# Patient Record
Sex: Female | Born: 1985 | Race: Black or African American | Hispanic: No | Marital: Married | Smoking: Never smoker
Health system: Southern US, Community
[De-identification: ages and names within clinical notes are randomized; demographics above are authoritative.]

## PROBLEM LIST (undated history)

## (undated) DIAGNOSIS — K219 Gastro-esophageal reflux disease without esophagitis: Secondary | ICD-10-CM

## (undated) DIAGNOSIS — S20419A Abrasion of unspecified back wall of thorax, initial encounter: Secondary | ICD-10-CM

## (undated) DIAGNOSIS — R1909 Other intra-abdominal and pelvic swelling, mass and lump: Secondary | ICD-10-CM

## (undated) DIAGNOSIS — G43909 Migraine, unspecified, not intractable, without status migrainosus: Secondary | ICD-10-CM

## (undated) DIAGNOSIS — D649 Anemia, unspecified: Secondary | ICD-10-CM

## (undated) DIAGNOSIS — M549 Dorsalgia, unspecified: Secondary | ICD-10-CM

## (undated) DIAGNOSIS — F419 Anxiety disorder, unspecified: Secondary | ICD-10-CM

## (undated) DIAGNOSIS — N939 Abnormal uterine and vaginal bleeding, unspecified: Secondary | ICD-10-CM

## (undated) HISTORY — DX: Anemia, unspecified: D64.9

## (undated) HISTORY — DX: Migraine, unspecified, not intractable, without status migrainosus: G43.909

---
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
## 2010-07-31 ENCOUNTER — Emergency Department (HOSPITAL_BASED_OUTPATIENT_CLINIC_OR_DEPARTMENT_OTHER)
Admission: EM | Admit: 2010-07-31 | Discharge: 2010-07-31 | Disposition: A | Discharge status: Home / Self Care | Payer: 59 | Attending: Emergency Medicine | Admitting: Emergency Medicine

## 2010-07-31 DIAGNOSIS — M545 Low back pain, unspecified: Secondary | ICD-10-CM | POA: Insufficient documentation

## 2010-07-31 DIAGNOSIS — R51 Headache: Secondary | ICD-10-CM | POA: Insufficient documentation

## 2010-07-31 LAB — URINALYSIS, ROUTINE W REFLEX MICROSCOPIC
Glucose, UA: NEGATIVE mg/dL
Hgb urine dipstick: NEGATIVE
Specific Gravity, Urine: 1.018 (ref 1.005–1.030)
Urobilinogen, UA: 0.2 mg/dL (ref 0.0–1.0)

## 2010-07-31 LAB — PREGNANCY, URINE: Preg Test, Ur: NEGATIVE

## 2010-08-14 ENCOUNTER — Encounter: Payer: Self-pay | Admitting: Family Medicine

## 2010-08-14 ENCOUNTER — Ambulatory Visit (INDEPENDENT_AMBULATORY_CARE_PROVIDER_SITE_OTHER): Payer: 59 | Admitting: Family Medicine

## 2010-08-14 DIAGNOSIS — M549 Dorsalgia, unspecified: Secondary | ICD-10-CM

## 2010-08-14 DIAGNOSIS — R51 Headache: Secondary | ICD-10-CM

## 2010-08-14 DIAGNOSIS — R079 Chest pain, unspecified: Secondary | ICD-10-CM

## 2010-08-14 MED ORDER — NAPROXEN 500 MG PO TABS: 500.0000 mg | ORAL_TABLET | Freq: Two times a day (BID) | ORAL | Status: DC

## 2010-08-14 MED ORDER — CYCLOBENZAPRINE HCL 10 MG PO TABS: 10.0000 mg | ORAL_TABLET | Freq: Three times a day (TID) | ORAL | Status: DC | PRN

## 2010-08-14 NOTE — Progress Notes (Signed)
  Subjective:    Patient ID: Katelyn [name], female    DOB: May 13, 1986, 25 y.o.   MRN: [id number]  HPI Here today to establish care.  [facility]  HAs- has been having daily HAs x3 weeks.  Went to ER last week b/c of severity and was started on Tramadol.  Pain would ease w/ tramadol but not resolve.  HAs are typically occipital and on top of head.  Pain described as a stabbing.  Pain will travel up the back of the head, including the neck.  During the HA pt will have some blurry vision, some dizziness, some nausea.  + photo and phonophobia.  No hx of migraines, no family hx of migraines.  No focal weakness.  Did not have head CT at ER.  Pt denies increased stressors.  Back pain- sxs started ~2 yrs ago.  Bilaterally.  Will run from mid thoracic to waistline.  Requires lumbar support to feel comfortable.  Pain w/ prolonged bending or stooping.  Pain improves w/ arching/extension.  Pain will radiate into butt and legs.  No relief w/ tramadol.  Pain will spontaneously improve.  Chest pressure- sxs started 1 week ago.  intermittant- 4x this week.  Improves w/ deep breathing.  Will last a few minutes each episode.  Some mild nausea.  No relation to eating.  Denies relation to stress.  Doesn't have GERD sxs.  No hx of asthma.    Review of Systems For ROS see HPI     Objective:   Physical Exam  Constitutional: She is oriented to person, place, and time. She appears well-developed and well-nourished. No distress.  HENT:  Head: Normocephalic and atraumatic.  Eyes: Conjunctivae and EOM are normal. Pupils are equal, round, and reactive to light.  Neck: Normal range of motion. Neck supple. No thyromegaly present.  Cardiovascular: Normal rate, regular rhythm, normal heart sounds and intact distal pulses.   No murmur heard. Pulmonary/Chest: Effort normal and breath sounds normal. No respiratory distress. She has no wheezes.  Musculoskeletal: Normal  range of motion. She exhibits no edema and no tenderness.       + Trap spasm bilaterally + paraspinal spasm Good flexion and extension of back, (-) SLR bilaterally  Lymphadenopathy:    She has no cervical adenopathy.  Neurological: She is alert and oriented to person, place, and time. She has normal reflexes. No cranial nerve deficit. Coordination normal.  Skin: Skin is warm and dry.  Psychiatric: She has a normal mood and affect. Her behavior is normal.          Assessment & Plan:

## 2010-08-14 NOTE — Patient Instructions (Signed)
Please schedule your complete physical at your convenience in the next 2-3 months (do not eat before this appt) Your EKG looks great!  No cause for concern! Keep track of your chest pressure and see if you can figure out a trend- time of day, relation to food, stress, etc Take the Naproxen 2x/day for 7-10 days for pain and then as needed.  Take w/ food to avoid upset stomach Use the muscle relaxer (flexeril) at night and on weekends as needed- it will make you sleepy Make sure you are wearing a good, supportive bra Get a massage if able Call with any questions or concerns Welcome!  We're glad to have you!

## 2010-08-25 DIAGNOSIS — R079 Chest pain, unspecified: Secondary | ICD-10-CM | POA: Insufficient documentation

## 2010-08-25 NOTE — Assessment & Plan Note (Signed)
Atypical for cardiac.  EKG normal.  Encouraged her to track sxs and determine if she can correlate her pain to anything in particular.  Reviewed supportive care and red flags that should prompt return.  Pt expressed understanding and is in agreement w/ plan.Marland Kitchen

## 2010-08-25 NOTE — Assessment & Plan Note (Signed)
Appear to be tension HA related to muscle spasm.  Neuro exam WNL.  Muscle relaxer and NSAIDs should improve sxs.  Will follow.

## 2010-08-25 NOTE — Assessment & Plan Note (Signed)
Pt's pain appears to be related to muscle spasm.  Start muscle relaxer, NSAID.  Heat prn.  Reviewed importance of good, supportive bra to take pressure off her shoulders and midback.  Reviewed supportive care and red flags that should prompt return.  Pt expressed understanding and is in agreement w/ plan.

## 2010-10-09 ENCOUNTER — Ambulatory Visit: Payer: 59 | Admitting: Family Medicine

## 2010-10-19 ENCOUNTER — Ambulatory Visit (INDEPENDENT_AMBULATORY_CARE_PROVIDER_SITE_OTHER): Payer: 59 | Admitting: Family Medicine

## 2010-10-19 VITALS — BP 110/78 | Temp 98.7°F | Wt 244.0 lb

## 2010-10-19 DIAGNOSIS — M549 Dorsalgia, unspecified: Secondary | ICD-10-CM

## 2010-10-19 DIAGNOSIS — N926 Irregular menstruation, unspecified: Secondary | ICD-10-CM | POA: Insufficient documentation

## 2010-10-19 LAB — POCT URINE PREGNANCY: Preg Test, Ur: NEGATIVE

## 2010-10-19 NOTE — Assessment & Plan Note (Signed)
Pt has seen GYN and felt that they 'blew me off'.  Most likely amenorrhea is related to body adjusting to NuvaRing and she is having premenstrual sxs.  Will monitor over the next few weeks now that pt has removed NuvaRing.  If period doesn't return will get pelvic US.  Pt expressed understanding and is in agreement w/ plan.

## 2010-10-19 NOTE — Patient Instructions (Signed)
If no period in the next 4-6 weeks, CALL ME! Continue the muscle relaxer and Naprosyn as needed We'll call you with your PT appt Continue heating pad Call with any questions or concerns Hang in there!!!

## 2010-10-19 NOTE — Progress Notes (Signed)
  Subjective:    Patient ID: Katelyn [name], female    DOB: Jul 02, 1985, 25 y.o.   MRN: [id number]  HPI Amenorrhea- no menses since March, home pregnancy tests are negative.  Using Nuva Ring- pulled ring out 10 days ago w/out menses.  Denies increased stress recently.  Started NuvaRing in Feb- missed period starting in March.  Has been on hormonal birth control previously and still had periods.  Pt feels she has gained weight quickly.  No hx of irregular menses.  Saw GYN last week and felt she rushed through it- [employer].  Yesterday had nausea, abd cramps, and back pain.  Back pain- will ease w/ muscle relaxers but not completely resolve.  Pain in bilateral trapezius and thoracic spine.  Has never seen ortho or done PT.     Review of Systems For ROS see HPI     Objective:   Physical Exam  Constitutional: She appears well-developed and well-nourished. No distress.  Musculoskeletal: She exhibits tenderness.       Bilateral trap and paraspinal spasm.  Good ROM of neck, back, and shoulders.          Assessment & Plan:

## 2010-10-19 NOTE — Assessment & Plan Note (Signed)
Pt continues to have diffuse muscle spasms in back and neck.  Continue NSAIDs and muscle relaxers.  Refer to PT.  Pt expressed understanding and is in agreement w/ plan.

## 2010-11-03 ENCOUNTER — Ambulatory Visit: Payer: 59 | Attending: Family Medicine | Admitting: Physical Therapy

## 2010-11-03 DIAGNOSIS — M2550 Pain in unspecified joint: Secondary | ICD-10-CM | POA: Insufficient documentation

## 2010-11-03 DIAGNOSIS — R293 Abnormal posture: Secondary | ICD-10-CM | POA: Insufficient documentation

## 2010-11-03 DIAGNOSIS — IMO0001 Reserved for inherently not codable concepts without codable children: Secondary | ICD-10-CM | POA: Insufficient documentation

## 2010-11-10 ENCOUNTER — Telehealth: Payer: Self-pay | Admitting: *Deleted

## 2010-11-10 ENCOUNTER — Ambulatory Visit (INDEPENDENT_AMBULATORY_CARE_PROVIDER_SITE_OTHER): Payer: 59 | Admitting: Family Medicine

## 2010-11-10 ENCOUNTER — Encounter: Payer: Self-pay | Admitting: Family Medicine

## 2010-11-10 DIAGNOSIS — Z803 Family history of malignant neoplasm of breast: Secondary | ICD-10-CM | POA: Insufficient documentation

## 2010-11-10 DIAGNOSIS — Z1231 Encounter for screening mammogram for malignant neoplasm of breast: Secondary | ICD-10-CM | POA: Insufficient documentation

## 2010-11-10 DIAGNOSIS — Z0000 Encounter for general adult medical examination without abnormal findings: Secondary | ICD-10-CM

## 2010-11-10 LAB — BASIC METABOLIC PANEL
CO2: 27 mEq/L (ref 19–32)
Chloride: 107 mEq/L (ref 96–112)
Creatinine, Ser: 0.8 mg/dL (ref 0.4–1.2)
GFR: 110.54 mL/min (ref >60.00)
Potassium: 3.9 mEq/L (ref 3.5–5.1)

## 2010-11-10 LAB — LIPID PANEL
HDL: 41.2 mg/dL (ref >39.00)
Triglycerides: 117 mg/dL (ref 0.0–149.0)

## 2010-11-10 LAB — CBC WITH DIFFERENTIAL/PLATELET
Basophils Absolute: 0 10*3/uL (ref 0.0–0.1)
Eosinophils Absolute: 0 10*3/uL (ref 0.0–0.7)
Hemoglobin: 12 g/dL (ref 12.0–15.0)
Lymphocytes Relative: 55.6 % — ABNORMAL HIGH (ref 12.0–46.0)
Monocytes Relative: 8.5 % (ref 3.0–12.0)
Neutro Abs: 1.2 10*3/uL — ABNORMAL LOW (ref 1.4–7.7)
Neutrophils Relative %: 35.4 % — ABNORMAL LOW (ref 43.0–77.0)
RBC: 4.39 Mil/uL (ref 3.87–5.11)
RDW: 14.2 % (ref 11.5–14.6)

## 2010-11-10 LAB — TSH: TSH: 1.43 u[IU]/mL (ref 0.35–5.50)

## 2010-11-10 LAB — HEPATIC FUNCTION PANEL: Albumin: 3.9 g/dL (ref 3.5–5.2)

## 2010-11-10 NOTE — Telephone Encounter (Signed)
Diannia Ruder from [facility] left message noting that they have spoken with the pt and she notes that family history of breast cancer was both grandmothers. One at age 48s and other in 43s. She notes that they go by the mothers history and her mother has not had breast cancer. They typically do not go by grandmothers history. She notes that they can do an ultrasound if the pt is having problems. Please advise.

## 2010-11-10 NOTE — Patient Instructions (Signed)
Follow up in 1 year or as needed We'll call you with your mammogram appt We'll notify you of your lab results Keep up the good work on diet and exercise!  You can do it! Call with any questions or concerns Have a great summer!

## 2010-11-10 NOTE — Progress Notes (Signed)
  Subjective:    Patient ID: Katelyn [name], female    DOB: [birth year], 25 y.o.   MRN: [id number]  HPI CPE- pt not interested in pap today b/c she has seen GYN and can't remember if they did one and knows insurance won't pay for 2.  No concerns about her health.  Strong family hx of breast cancer.  Both MGM and PGM.  MGM 1st dx'd in her 30s, PGM dx'd in her 44s.   Review of Systems Patient reports no vision/ hearing changes, adenopathy, fever, weight change,  persistant/recurrent hoarseness, swallowing issues, chest pain, palpitations, edema, persistant/recurrent cough, hemoptysis, dyspnea (rest/exertional/paroxysmal nocturnal), gastrointestinal bleeding (melena, rectal bleeding), abdominal pain, significant heartburn, bowel changes, GU symptoms (dysuria, hematuria, incontinence), Gyn symptoms (abnormal  bleeding, pain),  syncope, focal weakness, memory loss, numbness & tingling, skin/hair/nail changes, abnormal bruising or bleeding, anxiety, or depression.     Objective:   Physical Exam  General Appearance:    Alert, cooperative, no distress, appears stated age.  Obese.  Head:    Normocephalic, without obvious abnormality, atraumatic  Eyes:    PERRL, conjunctiva/corneas clear, EOM's intact, fundi    benign, both eyes  Ears:    Normal TM's and external ear canals, both ears  Nose:   Nares normal, septum midline, mucosa normal, no drainage    or sinus tenderness  Throat:   Lips, mucosa, and tongue normal; teeth and gums normal  Neck:   Supple, symmetrical, trachea midline, no adenopathy;    Thyroid: no enlargement/tenderness/nodules  Back:     Symmetric, no curvature, ROM normal, no CVA tenderness  Lungs:     Clear to auscultation bilaterally, respirations unlabored  Chest Wall:    No tenderness or deformity   Heart:    Regular rate and rhythm, S1 and S2 normal, no murmur, rub   or gallop  Breast Exam:    No tenderness, masses, or nipple abnormality  Abdomen:     Soft, non-tender,  bowel sounds active all four quadrants,    no masses, no organomegaly  Genitalia:    Deferred  Rectal:    Deferred  Extremities:   Extremities normal, atraumatic, no cyanosis or edema  Pulses:   2+ and symmetric all extremities  Skin:   Skin color, texture, turgor normal, no rashes or lesions  Lymph nodes:   Cervical, supraclavicular, and axillary nodes normal  Neurologic:   CNII-XII intact, normal strength, sensation and reflexes    throughout          Assessment & Plan:

## 2010-11-10 NOTE — Assessment & Plan Note (Signed)
Pt's PE WNL w/ exception of obesity.  Discussed importance of healthy diet and regular exercise.  Check labs.  Anticipatory guidance provided.

## 2010-11-10 NOTE — Telephone Encounter (Signed)
Pt is not having any current problems.  Her sister has already had her 1st mammo due to same family hx- was just trying to be proactive for pt.

## 2010-11-10 NOTE — Assessment & Plan Note (Signed)
Refer for early mamogram given family hx.

## 2010-11-11 NOTE — Telephone Encounter (Signed)
Kara notified.

## 2010-11-13 ENCOUNTER — Encounter: Payer: Self-pay | Admitting: *Deleted

## 2010-11-16 ENCOUNTER — Ambulatory Visit: Payer: 59 | Admitting: Physical Therapy

## 2010-11-19 ENCOUNTER — Encounter: Payer: 59 | Admitting: Rehabilitative and Restorative Service Providers"

## 2010-11-23 ENCOUNTER — Encounter: Payer: 59 | Admitting: Rehabilitative and Restorative Service Providers"

## 2010-11-26 ENCOUNTER — Ambulatory Visit: Payer: 59 | Attending: Family Medicine | Admitting: Rehabilitative and Restorative Service Providers"

## 2010-11-26 DIAGNOSIS — IMO0001 Reserved for inherently not codable concepts without codable children: Secondary | ICD-10-CM | POA: Insufficient documentation

## 2010-11-26 DIAGNOSIS — R293 Abnormal posture: Secondary | ICD-10-CM | POA: Insufficient documentation

## 2010-11-26 DIAGNOSIS — M2550 Pain in unspecified joint: Secondary | ICD-10-CM | POA: Insufficient documentation

## 2011-02-05 ENCOUNTER — Ambulatory Visit
Admission: RE | Admit: 2011-02-05 | Discharge: 2011-02-05 | Disposition: A | Discharge status: Home / Self Care | Payer: 59 | Source: Ambulatory Visit | Attending: Family Medicine | Admitting: Family Medicine

## 2011-02-05 ENCOUNTER — Telehealth: Payer: Self-pay

## 2011-02-05 ENCOUNTER — Encounter: Payer: Self-pay | Admitting: Family Medicine

## 2011-02-05 ENCOUNTER — Ambulatory Visit (INDEPENDENT_AMBULATORY_CARE_PROVIDER_SITE_OTHER): Payer: 59 | Admitting: Family Medicine

## 2011-02-05 DIAGNOSIS — M549 Dorsalgia, unspecified: Secondary | ICD-10-CM

## 2011-02-05 DIAGNOSIS — E010 Iodine-deficiency related diffuse (endemic) goiter: Secondary | ICD-10-CM

## 2011-02-05 DIAGNOSIS — R232 Flushing: Secondary | ICD-10-CM

## 2011-02-05 DIAGNOSIS — N951 Menopausal and female climacteric states: Secondary | ICD-10-CM

## 2011-02-05 DIAGNOSIS — E049 Nontoxic goiter, unspecified: Secondary | ICD-10-CM

## 2011-02-05 LAB — BASIC METABOLIC PANEL
CO2: 24 mEq/L (ref 19–32)
Calcium: 8.8 mg/dL (ref 8.4–10.5)
Glucose, Bld: 90 mg/dL (ref 70–99)
Sodium: 140 mEq/L (ref 135–145)

## 2011-02-05 LAB — CBC WITH DIFFERENTIAL/PLATELET
Basophils Relative: 0.5 % (ref 0.0–3.0)
Eosinophils Relative: 0.1 % (ref 0.0–5.0)
HCT: 37.5 % (ref 36.0–46.0)
Hemoglobin: 12.3 g/dL (ref 12.0–15.0)
Lymphs Abs: 2.1 10*3/uL (ref 0.7–4.0)
MCV: 83.1 fl (ref 78.0–100.0)
Monocytes Absolute: 0.5 10*3/uL (ref 0.1–1.0)
RBC: 4.51 Mil/uL (ref 3.87–5.11)
WBC: 4.4 10*3/uL — ABNORMAL LOW (ref 4.5–10.5)

## 2011-02-05 MED ORDER — NAPROXEN 500 MG PO TABS: 500.0000 mg | ORAL_TABLET | Freq: Two times a day (BID) | ORAL | Status: DC

## 2011-02-05 NOTE — Telephone Encounter (Signed)
Message copied by Beverely Low on Fri Feb 05, 2011  2:38 PM ------      Message from: Sheliah Hatch      Created: Fri Feb 05, 2011  1:05 PM       Very small thyroid nodules- plan is to recheck in 6-12 months

## 2011-02-05 NOTE — Progress Notes (Signed)
  Subjective:    Patient ID: Katelyn [name], female    DOB: Aug 30, 1985, 25 y.o.   MRN: [id number]  HPI Back pain- is taking 1 Naproxen daily for muscle 'tightness'.  Taking w/ food.  No upset stomach.  Reports this provides good relief.  Hot flashes- sxs started 1 week ago.  Will feel very hot and then it goes away after a few minutes, only to return a few minutes later w/ sweating.  Sweating episodes will last ~5 minutes and then resolve spontaneously.  Having 5 episodes/day.  Will also occur over night- pt will wake sweaty.  sxs started at end of last week and pt removed NuvaRing on Tuesday.   Review of Systems For ROS see HPI     Objective:   Physical Exam  Vitals reviewed. Constitutional: She appears well-developed and well-nourished. No distress.  HENT:  Head: Normocephalic and atraumatic.  Neck: Normal range of motion. Neck supple. Thyromegaly (diffuse enlargement w/out nodularity) present.  Cardiovascular: Normal rate, regular rhythm, normal heart sounds and intact distal pulses.   Pulmonary/Chest: Effort normal and breath sounds normal. No respiratory distress. She has no wheezes. She has no rales.  Lymphadenopathy:    She has no cervical adenopathy.  Skin: Skin is warm and dry.  Psychiatric: She has a normal mood and affect. Her behavior is normal.          Assessment & Plan:

## 2011-02-05 NOTE — Patient Instructions (Signed)
We will notify you of your [place] appt We'll let you know your lab results as soon as they are available I think this is all period related but we'll let you know Call with any questions or concerns Hang in there!!!

## 2011-02-05 NOTE — Telephone Encounter (Signed)
Pt aware.

## 2011-02-07 NOTE — Assessment & Plan Note (Signed)
This is most likely hormonal and due to upcoming menses.  Will check hormones to r/o premature ovarian failure but cautioned pt that these may not be accurate due to recent use of NuvaRing.  Will await labs and determine next appropriate step.

## 2011-02-07 NOTE — Assessment & Plan Note (Signed)
Improved on Naproxen.  Refill provided.  Reviewed possibility of gastritis w/ prolonged use.  Pt aware.

## 2011-02-07 NOTE — Assessment & Plan Note (Signed)
Given recent hot flashes and now thyromegaly on exam will get [place] to assess.

## 2011-02-08 ENCOUNTER — Telehealth: Payer: Self-pay

## 2011-02-08 NOTE — Progress Notes (Signed)
Quick Note:    Labs mailed.  ______

## 2011-02-08 NOTE — Telephone Encounter (Signed)
Labs mailed

## 2011-02-08 NOTE — Telephone Encounter (Signed)
Message copied by Beverely Low on Mon Feb 08, 2011 11:53 AM ------      Message from: Sheliah Hatch      Created: Fri Feb 05, 2011  4:51 PM       Labs look great!  Not menopausal- thyroid levels are normal.  This is all good news!

## 2011-05-03 ENCOUNTER — Emergency Department (INDEPENDENT_AMBULATORY_CARE_PROVIDER_SITE_OTHER): Payer: 59

## 2011-05-03 ENCOUNTER — Encounter (HOSPITAL_BASED_OUTPATIENT_CLINIC_OR_DEPARTMENT_OTHER): Payer: Self-pay | Admitting: *Deleted

## 2011-05-03 ENCOUNTER — Emergency Department (HOSPITAL_BASED_OUTPATIENT_CLINIC_OR_DEPARTMENT_OTHER)
Admission: EM | Admit: 2011-05-03 | Discharge: 2011-05-03 | Disposition: A | Discharge status: Home / Self Care | Payer: 59 | Attending: Emergency Medicine | Admitting: Emergency Medicine

## 2011-05-03 DIAGNOSIS — R0602 Shortness of breath: Secondary | ICD-10-CM

## 2011-05-03 DIAGNOSIS — R059 Cough, unspecified: Secondary | ICD-10-CM | POA: Insufficient documentation

## 2011-05-03 DIAGNOSIS — R05 Cough: Secondary | ICD-10-CM

## 2011-05-03 DIAGNOSIS — J40 Bronchitis, not specified as acute or chronic: Secondary | ICD-10-CM | POA: Insufficient documentation

## 2011-05-03 MED ORDER — ALBUTEROL SULFATE HFA 108 (90 BASE) MCG/ACT IN AERS
2.0000 | INHALATION_SPRAY | RESPIRATORY_TRACT | Status: DC
  Administered 2011-05-03: 2 via RESPIRATORY_TRACT
  Filled 2011-05-03: qty 6.7

## 2011-05-03 NOTE — ED Provider Notes (Signed)
History     CSN: [id number] Arrival date & time: 05/03/2011  2:17 PM   First MD Initiated Contact with Patient 05/03/11 1430      Chief Complaint  Patient presents with  . Cough     HPI Pt reports nasal congestion, cough and congestion x 3 days with mild SOB. No fevers or chills. No unilateral leg swelling or recent travel/surgery. No prior hx of DVT or PE. No sorethroat or myalgias. Nothing worsens or improves her symptoms. No orthopnea of PND or DOE. Symptoms are constant  Past Medical History  Diagnosis Date  . Anemia   . Migraines     History reviewed. No pertinent past surgical history.  Family History  Problem Relation Age of Onset  . Breast cancer Paternal Grandmother   . Breast cancer Maternal Grandmother   . Kidney disease Paternal Grandmother   . Kidney disease Maternal Grandmother   . Kidney disease Maternal Uncle   . Diabetes type I Paternal Grandmother   . Diabetes type I Maternal Grandmother   . Diabetes type I Maternal Aunt   . Diabetes Maternal Aunt   . Diabetes Maternal Uncle   . Asthma Sister     History  Substance Use Topics  . Smoking status: Never Smoker   . Smokeless tobacco: Not on file   Comment: casual smoker in college  . Alcohol Use: Yes     occ.    OB History    Grav Para Term Preterm Abortions TAB SAB Ect Mult Living                  Review of Systems  All other systems reviewed and are negative.    Allergies  Review of patient's allergies indicates no known allergies.  Home Medications   Current Outpatient Rx  Name Route Sig Dispense Refill  . CETIRIZINE HCL 10 MG PO TABS Oral Take 10 mg by mouth daily.      . CYCLOBENZAPRINE HCL 10 MG PO TABS Oral Take 1 tablet (10 mg total) by mouth every 8 (eight) hours as needed for muscle spasms. 30 tablet 1  . ETONOGESTREL-ETHINYL ESTRADIOL 0.12-0.015 MG/24HR VA RING Vaginal Place 1 each vaginally every 28 (twenty-eight) days. Insert vaginally and leave in place for 3 consecutive  weeks, then remove for 1 week.     Marland Kitchen NAPROXEN 500 MG PO TABS Oral Take 1 tablet (500 mg total) by mouth 2 (two) times daily with a meal. 60 tablet 2    BP 134/84  Pulse 87  Temp(Src) 98.1 F (36.7 C) (Oral)  Resp 22  Ht 5\' 4"  (1.626 m)  Wt 240 lb (108.863 kg)  BMI 41.20 kg/m2  SpO2 100%  LMP 04/07/2011  Physical Exam  Nursing note and vitals reviewed. Constitutional: She is oriented to person, place, and time. She appears well-developed and well-nourished. No distress.  HENT:  Head: Normocephalic and atraumatic.  Eyes: EOM are normal.  Neck: Normal range of motion.  Cardiovascular: Normal rate, regular rhythm and normal heart sounds.   Pulmonary/Chest: Effort normal and breath sounds normal.  Abdominal: Soft. She exhibits no distension. There is no tenderness.  Musculoskeletal: Normal range of motion.  Neurological: She is alert and oriented to person, place, and time.  Skin: Skin is warm and dry.  Psychiatric: She has a normal mood and affect. Judgment normal.    ED Course  Procedures (including critical care time)  Labs Reviewed - No data to display Dg Chest 2 View  05/03/2011  *  RADIOLOGY REPORT*  Clinical Data: Shortness of breath for several days, worse today.  CHEST - 2 VIEW  Comparison: None.  Findings: Mild increased perihilar markings suggest viral pneumonitis.  No lobar consolidation.  Heart size normal. Bones are unremarkable.  IMPRESSION: Mild increased perihilar markings suggest viral pneumonitis.  No lobar consolidation.  Original Report Authenticated By: Elsie Stain, M.D.   i personally reviewed the xray  1. Bronchitis       MDM  Likely viral upper respiratory tract infections.  The patient is well-appearing.  She is nontoxic.  No hypoxia on exam.  Lung exam is clear.  Normal work of breathing.  CXR normal.  Close followup with PCP. Cough improved after albuterol in the ER         Lyanne Co, MD 05/03/11 2212

## 2011-05-03 NOTE — ED Notes (Signed)
Pt c/o cough and SOB x 3 days 

## 2011-07-07 ENCOUNTER — Telehealth: Payer: Self-pay | Admitting: *Deleted

## 2011-07-07 ENCOUNTER — Encounter: Payer: Self-pay | Admitting: Internal Medicine

## 2011-07-07 ENCOUNTER — Ambulatory Visit (INDEPENDENT_AMBULATORY_CARE_PROVIDER_SITE_OTHER): Payer: 59 | Admitting: Internal Medicine

## 2011-07-07 VITALS — BP 124/78 | HR 95 | Temp 98.2°F | Wt 253.8 lb

## 2011-07-07 DIAGNOSIS — M549 Dorsalgia, unspecified: Secondary | ICD-10-CM

## 2011-07-07 DIAGNOSIS — M542 Cervicalgia: Secondary | ICD-10-CM

## 2011-07-07 DIAGNOSIS — M546 Pain in thoracic spine: Secondary | ICD-10-CM

## 2011-07-07 MED ORDER — TRAMADOL HCL 50 MG PO TABS: 50.0000 mg | ORAL_TABLET | Freq: Four times a day (QID) | ORAL | Status: AC | PRN

## 2011-07-07 NOTE — Patient Instructions (Signed)
Use a cervical memory foam pillow to prevent hyperextension or hyperflexion of the cervical spine. Use lowest dose of muscle relaxants at bedtime.

## 2011-07-07 NOTE — Telephone Encounter (Signed)
[facility] Triage Call Report Triage Record Num: [id number] Operator: Jeraldine Loots Patient Name: Katelyn [name] Call Date & Time: 07/07/2011 11:14:07AM Patient Phone: [phone] PCP: Lezlie Octave Patient Gender: Female PCP Fax : [phone] Patient DOB: 28-May-1985 Practice Name: [facility] Reason for Call: Caller: Katelyn [name]/Patient; PCP: Sheliah Hatch.; CB#: [phone]; For 1 1/2 weeks has had pain in both shoulders radiating into her chest. This is intermittent,can't think of anything that makes it worse or makes it occur. Tuesday, she had difficulty getting her breathe. Today, no problems breathing, just back pain. LMP 1/25. Scheduled for 315 with Dr. Alwyn Ren. Pt. declined earlier appt. with her provider. Protocol(s) Used: Back Symptoms Recommended Outcome per Protocol: See Provider within 72 Hours Reason for Outcome: Mild to moderate pain in back with normal activity or rest AND not responding to 72 hours of home care

## 2011-07-07 NOTE — Telephone Encounter (Signed)
Please note

## 2011-07-07 NOTE — Telephone Encounter (Signed)
Agree w appt
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
## 2011-07-08 LAB — SEDIMENTATION RATE: Sed Rate: 1 mm/hr (ref 0–22)

## 2012-05-18 ENCOUNTER — Ambulatory Visit (INDEPENDENT_AMBULATORY_CARE_PROVIDER_SITE_OTHER): Payer: 59 | Admitting: Family Medicine

## 2012-05-18 ENCOUNTER — Encounter: Payer: Self-pay | Admitting: Family Medicine

## 2012-05-18 VITALS — BP 116/70 | HR 91 | Temp 98.6°F | Ht 62.5 in | Wt 260.4 lb

## 2012-05-18 DIAGNOSIS — J329 Chronic sinusitis, unspecified: Secondary | ICD-10-CM | POA: Insufficient documentation

## 2012-05-18 MED ORDER — ONDANSETRON 4 MG PO TBDP: 4.0000 mg | ORAL_TABLET | Freq: Three times a day (TID) | ORAL | Status: DC | PRN

## 2012-05-18 MED ORDER — GUAIFENESIN-CODEINE 100-10 MG/5ML PO SYRP: 10.0000 mL | ORAL_SOLUTION | Freq: Three times a day (TID) | ORAL | Status: DC | PRN

## 2012-05-18 MED ORDER — AMOXICILLIN 875 MG PO TABS: 875.0000 mg | ORAL_TABLET | Freq: Two times a day (BID) | ORAL | Status: DC

## 2012-05-18 NOTE — Patient Instructions (Addendum)
Schedule your complete physical at your convenience Start the Amoxicillin twice daily- take w/ food if able Use the Zofran for nausea Cough syrup as needed- may cause drowsiness Drink plenty of fluids REST! Call with any questions or concerns Hang in there!

## 2012-05-18 NOTE — Progress Notes (Signed)
  Subjective:    Patient ID: Katelyn [name], female    DOB: [birth year], 27 y.o.   MRN: [id number]  HPI URI- sxs started 1 week ago w/ cough.  Taking OTC cough and cold meds w/out relief.  + anorexia.  + sore throat.  Cough is intermittently productive but mostly dry.  Having alternating fevers/chills.  Having facial pain, nasal congestion.  No ear pain.  Vomiting w/ meds/eating.  No diarrhea.  No known sick contacts.   Review of Systems For ROS see HPI     Objective:   Physical Exam  Vitals reviewed. Constitutional: She appears well-developed and well-nourished. No distress.  HENT:  Head: Normocephalic and atraumatic.  Right Ear: Tympanic membrane normal.  Left Ear: Tympanic membrane normal.  Nose: Mucosal edema and rhinorrhea present. Right sinus exhibits maxillary sinus tenderness and frontal sinus tenderness. Left sinus exhibits maxillary sinus tenderness and frontal sinus tenderness.  Mouth/Throat: Uvula is midline and mucous membranes are normal. Posterior oropharyngeal erythema present. No oropharyngeal exudate.  Eyes: Conjunctivae normal and EOM are normal. Pupils are equal, round, and reactive to light.  Neck: Normal range of motion. Neck supple.  Cardiovascular: Normal rate, regular rhythm and normal heart sounds.   Pulmonary/Chest: Effort normal and breath sounds normal. No respiratory distress. She has no wheezes.  Lymphadenopathy:    She has no cervical adenopathy.           Assessment & Plan:

## 2012-05-21 NOTE — Assessment & Plan Note (Signed)
New.  Pt's sxs and PE consistent w/ infxn.  Start abx.  Cough meds prn.  Reviewed supportive care and red flags that should prompt return.  Pt expressed understanding and is in agreement w/ plan.  

## 2012-08-23 ENCOUNTER — Ambulatory Visit: Payer: 59 | Admitting: Family Medicine

## 2012-08-24 ENCOUNTER — Encounter: Payer: Self-pay | Admitting: Family Medicine

## 2012-08-24 ENCOUNTER — Ambulatory Visit (INDEPENDENT_AMBULATORY_CARE_PROVIDER_SITE_OTHER): Payer: 59 | Admitting: Family Medicine

## 2012-08-24 VITALS — BP 98/70 | HR 89 | Temp 98.9°F | Ht 62.5 in | Wt 263.8 lb

## 2012-08-24 DIAGNOSIS — M549 Dorsalgia, unspecified: Secondary | ICD-10-CM

## 2012-08-24 DIAGNOSIS — R51 Headache: Secondary | ICD-10-CM

## 2012-08-24 MED ORDER — FLUTICASONE PROPIONATE 50 MCG/ACT NA SUSP: 2.0000 | Freq: Every day | NASAL | Status: DC

## 2012-08-24 MED ORDER — MELOXICAM 15 MG PO TABS: 15.0000 mg | ORAL_TABLET | Freq: Every day | ORAL | Status: DC

## 2012-08-24 MED ORDER — CYCLOBENZAPRINE HCL 10 MG PO TABS: 10.0000 mg | ORAL_TABLET | Freq: Three times a day (TID) | ORAL | Status: AC | PRN

## 2012-08-24 NOTE — Patient Instructions (Addendum)
We'll call you with your physical therapy appt Start the Mobic once daily- take w/ food Use the flexeril (muscle relaxer) at night for back and headache relief Heating pad as needed Add the Flonase- 2 sprays each nostril- to the Zyrtec daily Call with any questions or concerns Hang in there!

## 2012-08-24 NOTE — Assessment & Plan Note (Signed)
Chronic problem.  Suspect lumbar strain.  Pt w/ abdominal obesity which is contributing to problem.  Will start daily scheduled NSAIDs, heat prn, refer to PT for core strengthening.

## 2012-08-24 NOTE — Progress Notes (Signed)
  Subjective:    Patient ID: Katelyn [name], female    DOB: 12-15-1985, 27 y.o.   MRN: [id number]  HPI 'i'm still having issues w/ the back pain and HAs'- back pain is more constant, difficulty w/ walking, will need to sit after ~5 minutes.  Pain is bandlike across low back when walking.  Having bilateral upper back and shoulder pain when sitting.  Pain will radiate into butt and thighs bilaterally.  HAs will start in the neck and creep around to the front of the head.  No N/V, visual changes, photo/phonophobia.  Reports some subjective weakness in legs- 'like they'll give out- that's why i sit down'.  No numbness.   Review of Systems For ROS see HPI     Objective:   Physical Exam  Vitals reviewed. Constitutional: She is oriented to person, place, and time. She appears well-developed and well-nourished. No distress.  HENT:  Head: Normocephalic and atraumatic.  TMs WNL No TTP over sinuses Minimal nasal congestion  Eyes: Conjunctivae and EOM are normal. Pupils are equal, round, and reactive to light.  Neck: Normal range of motion. Neck supple.  Cardiovascular: Normal rate, regular rhythm, normal heart sounds and intact distal pulses.   Pulmonary/Chest: Effort normal and breath sounds normal. No respiratory distress. She has no wheezes. She has no rales.  Musculoskeletal: She exhibits tenderness (bilateral lumbar paraspinal tenderness). She exhibits no edema.  Pain w/ flexion, no pain w/ extension + SLR on L at 90 degrees + Trap spasm bilaterally  Lymphadenopathy:    She has no cervical adenopathy.  Neurological: She is alert and oriented to person, place, and time. She has normal reflexes. No cranial nerve deficit. Coordination normal.  Psychiatric: She has a normal mood and affect. Her behavior is normal. Judgment and thought content normal.          Assessment & Plan:

## 2012-08-24 NOTE — Assessment & Plan Note (Signed)
Ongoing.  Suspect tension HA based on description of starting in neck and radiating forward.  No red flags on hx or PE.  Start daily NSAIDs, heating pad as needed.  Muscle relaxer for night.  Reviewed supportive care and red flags that should prompt return.  Pt expressed understanding and is in agreement w/ plan.

## 2012-09-07 ENCOUNTER — Ambulatory Visit: Payer: 59 | Attending: Family Medicine | Admitting: Physical Therapy

## 2012-09-07 DIAGNOSIS — IMO0001 Reserved for inherently not codable concepts without codable children: Secondary | ICD-10-CM | POA: Insufficient documentation

## 2012-09-07 DIAGNOSIS — M6281 Muscle weakness (generalized): Secondary | ICD-10-CM | POA: Insufficient documentation

## 2012-09-07 DIAGNOSIS — M542 Cervicalgia: Secondary | ICD-10-CM | POA: Insufficient documentation

## 2012-09-07 DIAGNOSIS — M545 Low back pain, unspecified: Secondary | ICD-10-CM | POA: Insufficient documentation

## 2012-09-12 ENCOUNTER — Ambulatory Visit: Payer: 59 | Admitting: Rehabilitation

## 2012-09-14 ENCOUNTER — Ambulatory Visit: Payer: 59 | Attending: Family Medicine | Admitting: Physical Therapy

## 2012-09-14 DIAGNOSIS — M6281 Muscle weakness (generalized): Secondary | ICD-10-CM | POA: Insufficient documentation

## 2012-09-14 DIAGNOSIS — M545 Low back pain, unspecified: Secondary | ICD-10-CM | POA: Insufficient documentation

## 2012-09-14 DIAGNOSIS — M542 Cervicalgia: Secondary | ICD-10-CM | POA: Insufficient documentation

## 2012-09-14 DIAGNOSIS — IMO0001 Reserved for inherently not codable concepts without codable children: Secondary | ICD-10-CM | POA: Insufficient documentation

## 2012-09-19 ENCOUNTER — Ambulatory Visit: Payer: 59 | Admitting: Rehabilitation

## 2012-09-21 ENCOUNTER — Ambulatory Visit: Payer: 59 | Admitting: Physical Therapy

## 2012-09-26 ENCOUNTER — Ambulatory Visit: Payer: 59 | Admitting: Rehabilitation

## 2012-09-28 ENCOUNTER — Ambulatory Visit: Payer: 59 | Admitting: Rehabilitation

## 2012-10-03 ENCOUNTER — Ambulatory Visit: Payer: 59 | Admitting: Physical Therapy

## 2012-10-05 ENCOUNTER — Ambulatory Visit: Payer: 59 | Admitting: Rehabilitation

## 2012-10-10 ENCOUNTER — Ambulatory Visit: Payer: 59 | Admitting: Physical Therapy

## 2012-10-11 ENCOUNTER — Encounter: Payer: 59 | Admitting: Family Medicine

## 2012-10-12 ENCOUNTER — Ambulatory Visit: Payer: 59 | Admitting: Physical Therapy

## 2012-10-17 ENCOUNTER — Ambulatory Visit: Payer: 59 | Admitting: Physical Therapy

## 2012-10-19 ENCOUNTER — Ambulatory Visit: Payer: 59 | Admitting: Rehabilitation

## 2012-10-23 ENCOUNTER — Ambulatory Visit: Payer: 59 | Attending: Family Medicine | Admitting: Rehabilitation

## 2012-10-23 DIAGNOSIS — M545 Low back pain, unspecified: Secondary | ICD-10-CM | POA: Insufficient documentation

## 2012-10-23 DIAGNOSIS — IMO0001 Reserved for inherently not codable concepts without codable children: Secondary | ICD-10-CM | POA: Insufficient documentation

## 2012-10-23 DIAGNOSIS — M6281 Muscle weakness (generalized): Secondary | ICD-10-CM | POA: Insufficient documentation

## 2012-10-23 DIAGNOSIS — M542 Cervicalgia: Secondary | ICD-10-CM | POA: Insufficient documentation

## 2012-11-30 ENCOUNTER — Ambulatory Visit (INDEPENDENT_AMBULATORY_CARE_PROVIDER_SITE_OTHER): Payer: 59 | Admitting: Family Medicine

## 2012-11-30 ENCOUNTER — Encounter: Payer: Self-pay | Admitting: Family Medicine

## 2012-11-30 VITALS — BP 110/70 | HR 91 | Temp 98.9°F | Ht 62.5 in | Wt 258.0 lb

## 2012-11-30 DIAGNOSIS — E049 Nontoxic goiter, unspecified: Secondary | ICD-10-CM

## 2012-11-30 DIAGNOSIS — R51 Headache: Secondary | ICD-10-CM

## 2012-11-30 DIAGNOSIS — E010 Iodine-deficiency related diffuse (endemic) goiter: Secondary | ICD-10-CM

## 2012-11-30 DIAGNOSIS — Z0000 Encounter for general adult medical examination without abnormal findings: Secondary | ICD-10-CM

## 2012-11-30 DIAGNOSIS — Z1331 Encounter for screening for depression: Secondary | ICD-10-CM

## 2012-11-30 LAB — CBC WITH DIFFERENTIAL/PLATELET
Basophils Relative: 0.4 % (ref 0.0–3.0)
Eosinophils Absolute: 0 10*3/uL (ref 0.0–0.7)
Eosinophils Relative: 0 % (ref 0.0–5.0)
Hemoglobin: 12.8 g/dL (ref 12.0–15.0)
Lymphocytes Relative: 46.5 % — ABNORMAL HIGH (ref 12.0–46.0)
MCHC: 33.5 g/dL (ref 30.0–36.0)
Monocytes Relative: 7.4 % (ref 3.0–12.0)
Neutro Abs: 2.1 10*3/uL (ref 1.4–7.7)
RBC: 4.68 Mil/uL (ref 3.87–5.11)

## 2012-11-30 LAB — BASIC METABOLIC PANEL
BUN: 7 mg/dL (ref 6–23)
CO2: 26 mEq/L (ref 19–32)
Chloride: 105 mEq/L (ref 96–112)
Creatinine, Ser: 0.9 mg/dL (ref 0.4–1.2)
Glucose, Bld: 79 mg/dL (ref 70–99)

## 2012-11-30 LAB — LIPID PANEL: Cholesterol: 125 mg/dL (ref 0–200)

## 2012-11-30 LAB — HEPATIC FUNCTION PANEL
ALT: 43 U/L — ABNORMAL HIGH (ref 0–35)
Albumin: 4.2 g/dL (ref 3.5–5.2)
Total Protein: 7.7 g/dL (ref 6.0–8.3)

## 2012-11-30 LAB — TSH: TSH: 1.29 u[IU]/mL (ref 0.35–5.50)

## 2012-11-30 NOTE — Assessment & Plan Note (Signed)
Pt's sxs continue and are worsening.  Now having migraine like episodes. Will refer to neurology.

## 2012-11-30 NOTE — Assessment & Plan Note (Signed)
Set up repeat US

## 2012-11-30 NOTE — Progress Notes (Signed)
  Subjective:    Patient ID: Katelyn [name], female    DOB: 1985/12/13, 27 y.o.   MRN: [id number]  HPI CPE- UTD on GYN ([facility]).    Daily HAs- still having regular migraines.  Recently had an episode where vision went black- no passing out- vision returned spontaneously after 1-2 minutes (per pt report).  Since episode (last week) has felt fatigued.  No focal weakness during episode.  Thyromegaly- had [place] in 9/12.  Due for repeat.   Review of Systems Patient reports no vision/hearing changes, adenopathy,fever, weight change,  persistant/recurrent hoarseness , swallowing issues, chest pain, palpitations, edema, persistant/recurrent cough, hemoptysis, dyspnea (rest/exertional/paroxysmal nocturnal), gastrointestinal bleeding (melena, rectal bleeding), abdominal pain, significant heartburn, bowel changes, GU symptoms (dysuria, hematuria, incontinence), Gyn symptoms (abnormal  bleeding, pain),  syncope, focal weakness, memory loss, numbness & tingling, skin/hair/nail changes, abnormal bruising or bleeding, anxiety, or depression.     Objective:   Physical Exam General Appearance:    Alert, cooperative, no distress, appears stated age  Head:    Normocephalic, without obvious abnormality, atraumatic  Eyes:    PERRL, conjunctiva/corneas clear, EOM's intact, fundi    benign, both eyes  Ears:    Normal TM's and external ear canals, both ears  Nose:   Nares normal, septum midline, mucosa normal, no drainage    or sinus tenderness  Throat:   Lips, mucosa, and tongue normal; teeth and gums normal  Neck:   Supple, symmetrical, trachea midline, no adenopathy;    Thyroid: + thyromegaly  Back:     Symmetric, no curvature, ROM normal, no CVA tenderness  Lungs:     Clear to auscultation bilaterally, respirations unlabored  Chest Wall:    No tenderness or deformity   Heart:    Regular rate and rhythm, S1 and S2 normal, no murmur, rub   or gallop  Breast Exam:    Deferred to GYN  Abdomen:     Soft,  non-tender, bowel sounds active all four quadrants,    no masses, no organomegaly  Genitalia:    Deferred to GYN  Rectal:    Extremities:   Extremities normal, atraumatic, no cyanosis or edema  Pulses:   2+ and symmetric all extremities  Skin:   Skin color, texture, turgor normal, no rashes or lesions  Lymph nodes:   Cervical, supraclavicular, and axillary nodes normal  Neurologic:   CNII-XII intact, normal strength, sensation and reflexes    throughout          Assessment & Plan:

## 2012-11-30 NOTE — Assessment & Plan Note (Signed)
Pt's PE WNL w/ exception of known thyromegaly and obesity.  UTD on GYN.  Check labs.  Anticipatory guidance provided.  

## 2012-11-30 NOTE — Patient Instructions (Addendum)
We'll call you with your [place] and neurology appts We'll notify you of your lab results and make any changes if needed Keep up the good work on healthy diet and regular exercise Call with any questions or concerns Have a great summer!

## 2012-12-01 ENCOUNTER — Encounter: Payer: Self-pay | Admitting: *Deleted

## 2012-12-04 ENCOUNTER — Other Ambulatory Visit: Payer: 59

## 2012-12-04 LAB — VITAMIN D 1,25 DIHYDROXY
Vitamin D 1, 25 (OH)2 Total: 57 pg/mL (ref 18–72)
Vitamin D2 1, 25 (OH)2: <8 pg/mL
Vitamin D3 1, 25 (OH)2: 57 pg/mL

## 2012-12-05 ENCOUNTER — Encounter: Payer: Self-pay | Admitting: *Deleted

## 2012-12-13 ENCOUNTER — Encounter: Payer: Self-pay | Admitting: Neurology

## 2012-12-13 ENCOUNTER — Ambulatory Visit (INDEPENDENT_AMBULATORY_CARE_PROVIDER_SITE_OTHER): Payer: 59 | Admitting: Neurology

## 2012-12-13 VITALS — BP 106/60 | HR 78 | Temp 98.2°F | Ht 63.0 in | Wt 257.0 lb

## 2012-12-13 DIAGNOSIS — R51 Headache: Secondary | ICD-10-CM

## 2012-12-13 NOTE — Patient Instructions (Addendum)
1.  Referral to eye doctor to assess for papilledema. 2.  Follow up in clinic after eye doctor visit for further management.  Your eye appointment is at [facility] on August 14th at 3:00 pm. Their office is located at [address] in [place], [place] [address]  The office number is [phone].

## 2012-12-13 NOTE — Progress Notes (Signed)
NEUROLOGY CONSULTATION NOTE  Katelyn [name] MRN: [id number] DOB: [birth year]   Referring provider: Dr. Beverely Low Primary care provider: Dr. Beverely Low  Reason for consult:  Headaches  HISTORY OF PRESENT ILLNESS: Katelyn [name] is a 27 y.o. female obese woman with no significant past medical history who presents for the evaluation of chronic daily headaches. Onset of headaches started about one and half years ago. Headaches have gotten worse over the past 2 months. Location of her headaches varies. It's usually in the back of the head were shoulders. Sometimes it involves the for heterotopic the head. It's usually throbbing. It is usually an 8/10 intensity. There is no preceding aura. It is often associated with nausea, sometimes vomiting, photophobia, phonophobia, and sometimes osmophobia. Headaches are constant and daily. When she gets a headache she usually has to stay still or lay down with her eyes closed. However since she's been living with it so long, she just goes along with her daily activities. She reports no aggravating factors. She thinks that Flexeril helps relieve the headache. Current abortive therapy includes Flexeril. She is not on any current preventative medications. Past abortive medications include Excedrin, ibuprofen, and Tylenol. She's never been on any preventative medications for headache. She does not smoke. She drinks alcohol occasionally. Over the past 2 months she's tried to cut down on caffeine intake. She will have a cup of tea every now and then. Sleep is generally okay but she says she on average wakes up 3 times a night sometimes due to the headache. Headache is somewhat positional but it usually feels better when she is laying supine. She did report one episode where she had blackout of her vision for 15-20 seconds followed by feeling of drowsiness. She does note feeling lightheaded and constantly has to refocus her eyes. She does not wear contacts or glasses and has not seen  an eye doctor in a while. She does report past history of migraines. These headaches are similar to her past history of headaches but they are constant.  She does take birth control and drinks a drink with multivitamins. She is not on any other medications or over-the-counter medications except for Zyrtec.  PAST MEDICAL HISTORY: Past Medical History  Diagnosis Date  . Anemia   . Migraines     PAST SURGICAL HISTORY: Past Surgical History  Procedure Laterality Date  . No surgery      MEDICATIONS: Current Outpatient Prescriptions on File Prior to Visit  Medication Sig Dispense Refill  . cetirizine (ZYRTEC) 10 MG tablet Take 10 mg by mouth daily.        . cyclobenzaprine (FLEXERIL) 10 MG tablet Take 1 tablet (10 mg total) by mouth every 8 (eight) hours as needed for muscle spasms.  30 tablet  1  . fluticasone (FLONASE) 50 MCG/ACT nasal spray Place 2 sprays into the nose daily.  16 g  6  . meloxicam (MOBIC) 15 MG tablet Take 1 tablet (15 mg total) by mouth daily.  30 tablet  0   No current facility-administered medications on file prior to visit.    ALLERGIES: No Known Allergies  FAMILY HISTORY: Family History  Problem Relation Age of Onset  . Breast cancer Paternal Grandmother   . Breast cancer Maternal Grandmother   . Kidney disease Paternal Grandmother   . Kidney disease Maternal Grandmother   . Kidney disease Maternal Uncle   . Diabetes type I Paternal Grandmother   . Diabetes type I Maternal Grandmother   . Diabetes type I Maternal  Aunt   . Diabetes Maternal Aunt   . Diabetes Maternal Uncle   . Asthma Sister   . Heart failure Father     SOCIAL HISTORY: History   Social History  . Marital Status: Single    Spouse Name: N/A    Number of Children: N/A  . Years of Education: N/A   Occupational History  . Not on file.   Social History Main Topics  . Smoking status: Never Smoker   . Smokeless tobacco: Never Used     Comment: casual smoker in college  . Alcohol  Use: Yes     Comment: occasionally  . Drug Use: No  . Sexually Active: Not on file   Other Topics Concern  . Not on file   Social History Narrative  . No narrative on file    REVIEW OF SYSTEMS: Constitutional: No fevers, chills, or sweats, no generalized fatigue, change in appetite Eyes: No visual changes, double vision, eye pain Ear, nose and throat: No hearing loss, ear pain, nasal congestion, sore throat Cardiovascular: No chest pain, palpitations Respiratory:  No shortness of breath at rest or with exertion, wheezes GastrointestinaI: No nausea, vomiting, diarrhea, abdominal pain, fecal incontinence Genitourinary:  No dysuria, urinary retention or frequency Musculoskeletal:  No neck pain, back pain Integumentary: No rash, pruritus, skin lesions Neurological: as above Psychiatric: No depression, insomnia, anxiety Endocrine: No palpitations, fatigue, diaphoresis, mood swings, change in appetite, change in weight, increased thirst Hematologic/Lymphatic:  No anemia, purpura, petechiae. Allergic/Immunologic: no itchy/runny eyes, nasal congestion, recent allergic reactions, rashes  PHYSICAL EXAM: Filed Vitals:   12/13/12 1419  BP: 106/60  Pulse: 78  Temp: 98.2 F (36.8 C)   General: No acute distress Head:  Normocephalic/atraumatic Neck: supple, no paraspinal tenderness, full range of motion Back: No paraspinal tenderness Heart: regular rate and rhythm Lungs: Clear to auscultation bilaterally. Vascular: No carotid bruits. Neurological Exam: Mental status: alert and oriented to person, place, time and self, speech fluent and not dysarthric, language intact. Cranial nerves: CN I: not tested CN II: pupils equal, round and reactive to light, visual fields intact, fundi not visualized. Visual acuity 20/25 bilaterally. CN III, IV, VI:  full range of motion, no nystagmus, no ptosis CN V: facial sensation intact CN VII: upper and lower face symmetric CN VIII: hearing  intact CN IX, X: gag intact, uvula midline CN XI: sternocleidomastoid and trapezius muscles intact CN XII: tongue midline Bulk & Tone: normal, no fasciculations. Motor: 5/5 throughout Sensation: temperature and vibration intact Deep Tendon Reflexes: 2+ throughout, toes down Finger to nose testing: normal Gait: normal, able to walk in tandem. Romberg negative.  IMPRESSION & PLAN: Katelyn [name] is a 27 y.o. female with chronic daily headaches.  Most likely migraine, but given her body habitus and description of possible visual obscuration, idiopathic intracranial hypertension must be considered.  Unfortunately, I was not able to visualize her fundi to look for papilledema. 1.  We will refer to ophthalmology to evaluate for papilledema. 2.  I will have her follow up immediately after ophthalmology visit.  If there is evidence of papilledema, we will workup for idiopathic intracranial hypertension.  If not, we will treat for chronic migraine.  45 minutes spent with patient, over 50% spent counseling and coordinating care.  Thank you for allowing me to take part in the care of this patient.  Shon Millet, DO  CC: Neena Rhymes, MD

## 2012-12-28 ENCOUNTER — Telehealth: Payer: Self-pay | Admitting: Neurology

## 2012-12-28 NOTE — Telephone Encounter (Signed)
Picked up a call from Dr. Hazle Quant. He asked that I let Dr. Everlena Cooper know (Dr. Everlena Cooper out of the office this afternoon) that this patient had no papilledema upon exam.  **Dr. Everlena Cooper, Lorain Childes....Marland KitchenMarland Kitchen

## 2013-01-10 ENCOUNTER — Encounter: Payer: Self-pay | Admitting: Neurology

## 2013-01-10 ENCOUNTER — Ambulatory Visit (INDEPENDENT_AMBULATORY_CARE_PROVIDER_SITE_OTHER): Payer: 59 | Admitting: Neurology

## 2013-01-10 VITALS — BP 120/80 | HR 82 | Temp 98.9°F | Ht 64.0 in | Wt 257.0 lb

## 2013-01-10 DIAGNOSIS — R519 Headache, unspecified: Secondary | ICD-10-CM

## 2013-01-10 DIAGNOSIS — R51 Headache: Secondary | ICD-10-CM

## 2013-01-10 DIAGNOSIS — G43709 Chronic migraine without aura, not intractable, without status migrainosus: Secondary | ICD-10-CM

## 2013-01-10 MED ORDER — TOPIRAMATE 50 MG PO TABS: ORAL_TABLET | ORAL | Status: DC

## 2013-01-10 NOTE — Progress Notes (Signed)
NEUROLOGY FOLLOW UP OFFICE NOTE  Katelyn [name] [id number]  HISTORY OF PRESENT ILLNESS: Katelyn [name] is a 27 year old woman following up for chronic daily headaches.  Records and images were personally reviewed where available.   Onset:  Onset of headaches started about one and half years ago, but have gotten worse over the past couple of months.  Location:  location of her headaches varies (usually back of head and shoulders, sometimes front or top of head).  Quality:  It's usually throbbing.  Intensity: tt is usually an 8/10 intensity.  Headache is somewhat positional but it usually feels better when she is laying supine.  Since last visit, they have been improved (5/10). Aura:  There is no preceding aura.  Associated symptoms:  It is often associated with nausea, sometimes vomiting, photophobia, phonophobia, and sometimes osmophobia.  She did report one episode where she had blackout of her vision for 15-20 seconds followed by feeling of drowsiness. She does note feeling lightheaded and constantly has to refocus her eyes. Frequency and duration:  Headaches are constant and daily.  Activity:  When she gets a headache she usually has to stay still or lay down with her eyes closed. However since she's been living with it so long, she just goes along with her daily activities.  Aggravating factors:  She reports no aggravating factors.  Relieving factors:  She thinks that Flexeril helps relieve the headache.   Current abortive therapy:  Flexeril at bedtime.  Nothing during the day.  Current preventative therapy:  none.   Past abortive therapy: Excedrin, ibuprofen, and Tylenol.  Past preventative therapy: none.  She does report past history of migraines. These headaches are similar to her past history of headaches but they are constant.   She does not smoke. She drinks alcohol occasionally. Over the past 2 months she's tried to cut down on caffeine intake. She will have a cup of tea every  now and then.  Sleep is generally okay but she says she on average wakes up 3 times a night sometimes due to the headache.  She does not wear contacts or glasses and has not seen an eye doctor in a while. She does take birth control and drinks a drink with multivitamins. She is not on any other medications or over-the-counter medications except for Zyrtec.  I referred her to ophthalmology for evaluation, and there was no papilledema.  Intensity of headaches have improved since last time, may be attributed to increased exercise.  PAST MEDICAL HISTORY: Past Medical History  Diagnosis Date  . Anemia   . Migraines     MEDICATIONS: Current Outpatient Prescriptions on File Prior to Visit  Medication Sig Dispense Refill  . cetirizine (ZYRTEC) 10 MG tablet Take 10 mg by mouth daily.        . cyclobenzaprine (FLEXERIL) 10 MG tablet Take 1 tablet (10 mg total) by mouth every 8 (eight) hours as needed for muscle spasms.  30 tablet  1  . fluticasone (FLONASE) 50 MCG/ACT nasal spray Place 2 sprays into the nose daily.  16 g  6  . meloxicam (MOBIC) 15 MG tablet Take 1 tablet (15 mg total) by mouth daily.  30 tablet  0   No current facility-administered medications on file prior to visit.    ALLERGIES: No Known Allergies  FAMILY HISTORY: Family History  Problem Relation Age of Onset  . Breast cancer Paternal Grandmother   . Breast cancer Maternal Grandmother   . Kidney disease Paternal Grandmother   .  Kidney disease Maternal Grandmother   . Kidney disease Maternal Uncle   . Diabetes type I Paternal Grandmother   . Diabetes type I Maternal Grandmother   . Diabetes type I Maternal Aunt   . Diabetes Maternal Aunt   . Diabetes Maternal Uncle   . Asthma Sister   . Heart failure Father     SOCIAL HISTORY: History   Social History  . Marital Status: Single    Spouse Name: N/A    Number of Children: N/A  . Years of Education: N/A   Occupational History  . Not on file.   Social  History Main Topics  . Smoking status: Never Smoker   . Smokeless tobacco: Never Used     Comment: casual smoker in college  . Alcohol Use: Yes     Comment: occasionally  . Drug Use: No  . Sexual Activity: Not on file   Other Topics Concern  . Not on file   Social History Narrative  . No narrative on file    PHYSICAL EXAM: Filed Vitals:   01/10/13 1513  BP: 120/80  Pulse: 82  Temp: 98.9 F (37.2 C)   General: No acute distress Head:  Normocephalic/atraumatic Neck: supple, no paraspinal tenderness, full range of motion Back: No paraspinal tenderness Neurological Exam: alert and oriented to person, place, and time, speech fluent and not dysarthric, language intact.  CN II-XII intact, bulk and tone normal, muscle strength 5/5 throughout,  finger to nose intact, gait normal.  IMPRESSION & PLAN: Chronic daily headaches, probable chronic migraine without aura.  At this point, I do not suspect idiopathic intracranial hypertension. 1.  Will start topiramate to goal of 50mg  BID.  Side effects discussed. 2. Exercise, stay hydrated, sleep well 3. Follow up in 2 months.  Shon Millet, DO  CC:  Neena Rhymes, MD

## 2013-01-10 NOTE — Patient Instructions (Addendum)
  1.  We will start topiramate (Topamax) 50mg  tablets.  We will increase the dose as follows to goal of 50mg  twice daily:       Morning Evening  Week 1:     0  0.5 tab  Week 2:    0.5 tab  0.5 tab  Week 3:   0.5 tab  1 tab  Week 4 and thereafter 1 tab  1 tab  Possible side effects include: impaired thinking, sedation, paresthesias (numbness and tingling) and weight loss.  It may cause dehydration and there is a small risk for kidney stones, so make sure to stay hydrated with water during the day.  There is also a very small risk for glaucoma, so if you notice any change in your vision while taking this medication, see an ophthalmologist.  There is also a very small risk of possible suicidal ideation, as it the case with all antiepileptic medications.  2.  Limit use of pain relievers to no more than 2 to 3 days out of the week.  These medications include acetaminophen, ibuprofen, triptans and narcotics.  This will help reduce risk of rebound headaches. 3.  Keep a headache diary. 4.  Stay adequately hydrated. 5.  Maintain good sleep hygiene. 6.  Maintain proper stress management. 7.  Follow up in 2 months.

## 2013-03-12 ENCOUNTER — Ambulatory Visit: Payer: 59 | Admitting: Neurology

## 2013-03-28 ENCOUNTER — Ambulatory Visit: Payer: Self-pay | Admitting: Obstetrics & Gynecology

## 2013-04-30 ENCOUNTER — Ambulatory Visit: Payer: 59 | Admitting: Obstetrics & Gynecology

## 2013-05-07 ENCOUNTER — Ambulatory Visit: Payer: Self-pay | Admitting: Obstetrics & Gynecology

## 2013-05-08 ENCOUNTER — Ambulatory Visit: Payer: 59 | Admitting: Obstetrics & Gynecology

## 2013-05-16 ENCOUNTER — Ambulatory Visit: Payer: 59 | Admitting: Obstetrics & Gynecology

## 2013-06-28 ENCOUNTER — Ambulatory Visit: Payer: 59 | Admitting: Obstetrics & Gynecology

## 2014-05-13 ENCOUNTER — Encounter: Payer: Self-pay | Admitting: *Deleted

## 2014-10-21 LAB — OB RESULTS CONSOLE ANTIBODY SCREEN: ANTIBODY SCREEN: NEGATIVE

## 2014-10-21 LAB — OB RESULTS CONSOLE RUBELLA ANTIBODY, IGM: Rubella: NON-IMMUNE/NOT IMMUNE

## 2014-10-21 LAB — OB RESULTS CONSOLE RPR: RPR: NONREACTIVE

## 2014-10-21 LAB — OB RESULTS CONSOLE ABO/RH: RH TYPE: POSITIVE

## 2014-10-21 LAB — OB RESULTS CONSOLE HIV ANTIBODY (ROUTINE TESTING): HIV: NONREACTIVE

## 2014-10-21 LAB — OB RESULTS CONSOLE HEPATITIS B SURFACE ANTIGEN: HEP B S AG: NEGATIVE

## 2014-10-30 LAB — OB RESULTS CONSOLE GC/CHLAMYDIA
CHLAMYDIA, DNA PROBE: NEGATIVE
Gonorrhea: NEGATIVE

## 2014-12-06 ENCOUNTER — Encounter (HOSPITAL_COMMUNITY): Payer: Self-pay | Admitting: *Deleted

## 2014-12-06 ENCOUNTER — Inpatient Hospital Stay (HOSPITAL_COMMUNITY)
Admission: AD | Admit: 2014-12-06 | Discharge: 2014-12-06 | Disposition: A | Discharge status: Home / Self Care | Payer: 59 | Source: Ambulatory Visit | Attending: Obstetrics and Gynecology | Admitting: Obstetrics and Gynecology

## 2014-12-06 DIAGNOSIS — R102 Pelvic and perineal pain: Secondary | ICD-10-CM | POA: Diagnosis present

## 2014-12-06 DIAGNOSIS — L0231 Cutaneous abscess of buttock: Secondary | ICD-10-CM | POA: Insufficient documentation

## 2014-12-06 LAB — URINALYSIS, ROUTINE W REFLEX MICROSCOPIC
BILIRUBIN URINE: NEGATIVE
GLUCOSE, UA: NEGATIVE mg/dL
Leukocytes, UA: NEGATIVE
NITRITE: NEGATIVE
PH: 6 (ref 5.0–8.0)
Protein, ur: NEGATIVE mg/dL
Specific Gravity, Urine: 1.025 (ref 1.005–1.030)
UROBILINOGEN UA: 0.2 mg/dL (ref 0.0–1.0)

## 2014-12-06 LAB — URINE MICROSCOPIC-ADD ON

## 2014-12-06 MED ORDER — SULFAMETHOXAZOLE-TRIMETHOPRIM 800-160 MG PO TABS: 1.0000 | ORAL_TABLET | Freq: Two times a day (BID) | ORAL | Status: DC

## 2014-12-06 MED ORDER — HYDROMORPHONE HCL 1 MG/ML IJ SOLN
1.0000 mg | Freq: Once | INTRAMUSCULAR | Status: AC
Start: 2014-12-06 — End: 2014-12-06
  Administered 2014-12-06: 1 mg via INTRAMUSCULAR
  Filled 2014-12-06: qty 1

## 2014-12-06 NOTE — MAU Note (Signed)
C/o ? Insect/spider bite on L buttock in the center; noticed bited about 3 days  Ago and it is progressively getting larger and more tender; bit stings when pt urinates; a/so pt has had constipation for past 2 weeks; FHR  dopplered @ 150;

## 2014-12-06 NOTE — MAU Provider Note (Signed)
History     CSN: [id number]  Arrival date and time: 12/06/14 1355   First Provider Initiated Contact with Patient 12/06/14 1641      Chief Complaint  Patient presents with  . Abdominal Pain  . Bite on Left Inner Buttocks    This is a 29 y.o. female at [redacted]w[redacted]d who presents with c/o area of swelling and pain on the left buttocks, near her vagina.  States the swelling and pain started about 3 days ago.  Had gotten bigger and more tender in the past few days. Pain is dull and sharp, does not radiate.  Denies fever or other associated symptoms.  Also complains of constipation for two weeks. Has not tried anything for it.    Vaginal Pain The patient's pertinent negatives include no genital itching, genital lesions, genital odor, genital rash, pelvic pain or vaginal bleeding. This is a new problem. The current episode started in the past 7 days. The problem has been gradually worsening. The pain is moderate. The problem affects the left side. She is pregnant. Associated symptoms include constipation. Pertinent negatives include no abdominal pain, back pain, chills, diarrhea, dysuria, fever, joint pain, nausea, urgency or vomiting. The symptoms are aggravated by urinating. She has tried nothing for the symptoms. She uses nothing for contraception.   RN Note: C/o ? Insect/spider bite on L buttock in the center; noticed bited about 3 days Ago and it is progressively getting larger and more tender; bit stings when pt urinates; a/so pt has had constipation for past 2 weeks; FHR dopplered @ 150;           OB History    Gravida Para Term Preterm AB TAB SAB Ectopic Multiple Living   1               Past Medical History  Diagnosis Date  . Anemia   . Migraines     Past Surgical History  Procedure Laterality Date  . No surgery      Family History  Problem Relation Age of Onset  . Breast cancer Paternal Grandmother   . Breast cancer Maternal Grandmother   . Kidney disease Paternal  Grandmother   . Kidney disease Maternal Grandmother   . Kidney disease Maternal Uncle   . Diabetes type I Paternal Grandmother   . Diabetes type I Maternal Grandmother   . Diabetes type I Maternal Aunt   . Diabetes Maternal Aunt   . Diabetes Maternal Uncle   . Asthma Sister   . Heart failure Father     History  Substance Use Topics  . Smoking status: Never Smoker   . Smokeless tobacco: Never Used     Comment: casual smoker in college  . Alcohol Use: Yes     Comment: occasionally    Allergies: No Known Allergies  Prescriptions prior to admission  Medication Sig Dispense Refill Last Dose  . cetirizine (ZYRTEC) 10 MG tablet Take 10 mg by mouth daily.     12/05/2014 at Unknown time  . Prenatal Vit-Fe Fumarate-FA (PRENATAL MULTIVITAMIN) TABS tablet Take 1 tablet by mouth daily at 12 noon.   12/05/2014 at Unknown time    Review of Systems  Constitutional: Negative for fever, chills and malaise/fatigue.  Gastrointestinal: Positive for constipation. Negative for nausea, vomiting, abdominal pain and diarrhea.  Genitourinary: Positive for vaginal pain. Negative for dysuria, urgency and pelvic pain.  Musculoskeletal: Negative for myalgias, back pain and joint pain.  Neurological: Negative for weakness.  Other systems negative  Physical Exam   Blood pressure 109/66, pulse 106, temperature 99.3 F (37.4 C), temperature source Oral, resp. rate 16, height 5\' 3"  (1.6 m), weight 246 lb 6 oz (111.755 kg), last menstrual period 08/22/2014.  Physical Exam  Constitutional: She is oriented to person, place, and time. She appears well-developed and well-nourished. No distress.  HENT:  Head: Normocephalic.  Cardiovascular: Normal rate and regular rhythm.   Respiratory: Effort normal. No respiratory distress.  GI: Soft. She exhibits no distension. There is no tenderness. There is no rebound and no guarding.  Genitourinary: Vagina normal. No vaginal discharge found.  Cervix not examined, not  indicated   Musculoskeletal: Normal range of motion.  Neurological: She is alert and oriented to person, place, and time.  Skin: Skin is warm and dry.  Psychiatric: She has a normal mood and affect.    MAU Course  Procedures  MDM Dr Rana Snare came by to speak with patient and asked me to do the I&D for the abscess. He will follow her in the office. Dilaudid IM given prior to procedure  Abscess I&D  Enlarged abscess palpated and visible on Left buttock, near vaginal introitus at about 5 o' clock position.   Written informed consent was obtained.  Discussed complications and possible outcomes of procedure including recurrence of abscess, scarring leading to infecton, bleeding, dyspareunia, distortion of anatomy.   Patient was examined in the dorsal lithotomy position and mass was identified.  The area was prepped with Iodine and draped in a sterile manner.  1% Lidocaine (3 ml) was then used to infiltrate area on top of the cyst, and where incision would be made.   A 7 mm incision was made using a sterile scapel. Upon palpation of the mass, a moderate amount of bloody purulent drainage was expressed through the incision. A hemostat was used to break up loculations, which resulted in expression of more bloody purulent drainage.   The incision was left open, and pressure applied to slow bleeding.   Patient tolerated the procedure well, reported feeling " a lot better." - Bactrim DS bid x 7 days for treatment - Recommended Sitz baths bid and Tylenol  prn pain.   She was told to call to be examined if she experiences increasing swelling, pain, vaginal discharge, or fever.  - She was instructed to wear a peripad to absorb discharge, and to maintain pelvic rest.  - She will need an appointment in the office in 2-4 weeks from now for evaluation of healing, or sooner if needed.    Assessment and Plan  A:  Gluteal abscess (not gluteal fold or Pilonidal)        S/P incision and drainage of abscess        SIUP at [redacted]w[redacted]d    P:  Discharge home       Wound care discussed       Rx Septra DS for 7 days to combat further infection       Tylenol for pain       Followup in office for wound evaluation, sooner in MAU if complications develop   [facility] 12/06/2014, 5:09 PM

## 2014-12-06 NOTE — MAU Note (Signed)
Patient presents at [redacted] weeks gestation (states it was confirmed at [facility]) with c/o abdominal pain and a bite on her left inner buttock that is causing unbearable pain. States she has felt a flutter only and denies bleeding or discharge.

## 2014-12-06 NOTE — Discharge Instructions (Signed)

## 2014-12-06 NOTE — MAU Note (Signed)
?   Bartholin cyst instead of insect bite;

## 2015-04-29 LAB — OB RESULTS CONSOLE GBS: GBS: POSITIVE

## 2015-05-29 ENCOUNTER — Encounter (HOSPITAL_COMMUNITY): Payer: Self-pay | Admitting: *Deleted

## 2015-05-29 ENCOUNTER — Inpatient Hospital Stay (HOSPITAL_COMMUNITY)
Admission: AD | Admit: 2015-05-29 | Discharge: 2015-06-01 | DRG: 765 | Disposition: A | Discharge status: Home / Self Care | Payer: 59 | Source: Ambulatory Visit | Attending: Obstetrics & Gynecology | Admitting: Obstetrics & Gynecology

## 2015-05-29 DIAGNOSIS — O9902 Anemia complicating childbirth: Secondary | ICD-10-CM | POA: Diagnosis present

## 2015-05-29 DIAGNOSIS — Z825 Family history of asthma and other chronic lower respiratory diseases: Secondary | ICD-10-CM | POA: Diagnosis not present

## 2015-05-29 DIAGNOSIS — Z833 Family history of diabetes mellitus: Secondary | ICD-10-CM

## 2015-05-29 DIAGNOSIS — Z3A40 40 weeks gestation of pregnancy: Secondary | ICD-10-CM | POA: Diagnosis not present

## 2015-05-29 DIAGNOSIS — R51 Headache: Secondary | ICD-10-CM | POA: Diagnosis present

## 2015-05-29 DIAGNOSIS — D649 Anemia, unspecified: Secondary | ICD-10-CM | POA: Diagnosis present

## 2015-05-29 DIAGNOSIS — Z8249 Family history of ischemic heart disease and other diseases of the circulatory system: Secondary | ICD-10-CM | POA: Diagnosis not present

## 2015-05-29 DIAGNOSIS — O99214 Obesity complicating childbirth: Secondary | ICD-10-CM | POA: Diagnosis present

## 2015-05-29 DIAGNOSIS — Z803 Family history of malignant neoplasm of breast: Secondary | ICD-10-CM

## 2015-05-29 DIAGNOSIS — Z6841 Body Mass Index (BMI) 40.0 and over, adult: Secondary | ICD-10-CM | POA: Diagnosis not present

## 2015-05-29 DIAGNOSIS — O480 Post-term pregnancy: Secondary | ICD-10-CM | POA: Diagnosis present

## 2015-05-29 DIAGNOSIS — Z3490 Encounter for supervision of normal pregnancy, unspecified, unspecified trimester: Secondary | ICD-10-CM

## 2015-05-29 LAB — TYPE AND SCREEN
ABO/RH(D): O POS
ANTIBODY SCREEN: NEGATIVE

## 2015-05-29 LAB — CBC
HEMATOCRIT: 38.4 % (ref 36.0–46.0)
Hemoglobin: 12.6 g/dL (ref 12.0–15.0)
MCH: 26.9 pg (ref 26.0–34.0)
MCHC: 32.8 g/dL (ref 30.0–36.0)
MCV: 82.1 fL (ref 78.0–100.0)
Platelets: 160 10*3/uL (ref 150–400)
RBC: 4.68 MIL/uL (ref 3.87–5.11)
RDW: 15.5 % (ref 11.5–15.5)
WBC: 7.7 10*3/uL (ref 4.0–10.5)

## 2015-05-29 LAB — ABO/RH: ABO/RH(D): O POS

## 2015-05-29 MED ORDER — PENICILLIN G POTASSIUM 5000000 UNITS IJ SOLR
2.5000 10*6.[IU] | INTRAVENOUS | Status: DC
  Administered 2015-05-30 (×2): 2.5 10*6.[IU] via INTRAVENOUS
  Filled 2015-05-29 (×6): qty 2.5

## 2015-05-29 MED ORDER — LACTATED RINGERS IV SOLN: 2.5000 [IU]/h | INTRAVENOUS | Status: DC

## 2015-05-29 MED ORDER — OXYCODONE-ACETAMINOPHEN 5-325 MG PO TABS: 2.0000 | ORAL_TABLET | ORAL | Status: DC | PRN

## 2015-05-29 MED ORDER — LACTATED RINGERS IV SOLN
INTRAVENOUS | Status: DC
  Administered 2015-05-29: 125 mL/h via INTRAVENOUS
  Administered 2015-05-30 (×5): via INTRAVENOUS

## 2015-05-29 MED ORDER — FLEET ENEMA 7-19 GM/118ML RE ENEM: 1.0000 | ENEMA | RECTAL | Status: DC | PRN

## 2015-05-29 MED ORDER — LACTATED RINGERS IV SOLN
500.0000 mL | INTRAVENOUS | Status: DC | PRN
  Administered 2015-05-30 (×2): 500 mL via INTRAVENOUS

## 2015-05-29 MED ORDER — BUTORPHANOL TARTRATE 1 MG/ML IJ SOLN: 1.0000 mg | INTRAMUSCULAR | Status: DC | PRN

## 2015-05-29 MED ORDER — OXYTOCIN BOLUS FROM INFUSION: 500.0000 mL | INTRAVENOUS | Status: DC

## 2015-05-29 MED ORDER — MISOPROSTOL 25 MCG QUARTER TABLET
25.0000 ug | ORAL_TABLET | ORAL | Status: DC | PRN
  Administered 2015-05-29 – 2015-05-30 (×3): 25 ug via VAGINAL
  Filled 2015-05-29 (×3): qty 0.25

## 2015-05-29 MED ORDER — LIDOCAINE HCL (PF) 1 % IJ SOLN: 30.0000 mL | INTRAMUSCULAR | Status: DC | PRN

## 2015-05-29 MED ORDER — PENICILLIN G POTASSIUM 5000000 UNITS IJ SOLR
5.0000 10*6.[IU] | Freq: Once | INTRAVENOUS | Status: AC
  Administered 2015-05-30: 5 10*6.[IU] via INTRAVENOUS
  Filled 2015-05-29: qty 5

## 2015-05-29 MED ORDER — ZOLPIDEM TARTRATE 5 MG PO TABS
5.0000 mg | ORAL_TABLET | Freq: Every evening | ORAL | Status: DC | PRN
  Administered 2015-05-29: 5 mg via ORAL
  Filled 2015-05-29: qty 1

## 2015-05-29 MED ORDER — TERBUTALINE SULFATE 1 MG/ML IJ SOLN
0.2500 mg | Freq: Once | INTRAMUSCULAR | Status: AC | PRN
  Administered 2015-05-30: 0.25 mg via SUBCUTANEOUS
  Filled 2015-05-29: qty 1

## 2015-05-29 MED ORDER — ONDANSETRON HCL 4 MG/2ML IJ SOLN: 4.0000 mg | Freq: Four times a day (QID) | INTRAMUSCULAR | Status: DC | PRN

## 2015-05-29 MED ORDER — ACETAMINOPHEN 325 MG PO TABS: 650.0000 mg | ORAL_TABLET | ORAL | Status: DC | PRN

## 2015-05-29 MED ORDER — CITRIC ACID-SODIUM CITRATE 334-500 MG/5ML PO SOLN
30.0000 mL | ORAL | Status: DC | PRN
  Administered 2015-05-30: 30 mL via ORAL
  Filled 2015-05-29: qty 15

## 2015-05-29 MED ORDER — OXYCODONE-ACETAMINOPHEN 5-325 MG PO TABS: 1.0000 | ORAL_TABLET | ORAL | Status: DC | PRN

## 2015-05-29 NOTE — MAU Provider Note (Signed)
Ms. Liberty HandyJennifer H Calabrese is a 30 y.o. G1P0 at 8318w0d who presents to MAU today for continued EFM. The patient had a reactive tracing in the office, but the latter part of the tracing was not showing significant activity.   Medical screening exam complete  Discussed patient EFM with Dr. Langston MaskerMorris. Will plan to admit for induction of labor as patient was already scheduled for induction tonight.   Marny LowensteinJulie N Aodhan Scheidt, PA-C 05/29/2015 5:02 PM

## 2015-05-29 NOTE — MAU Note (Signed)
Pt presents to MAU for non stress test. Pt was evaluated by Dr Henderson Cloudomblin earlier today and is scheduled for an induction tonight at midnight

## 2015-05-30 ENCOUNTER — Inpatient Hospital Stay (HOSPITAL_COMMUNITY): Payer: 59

## 2015-05-30 ENCOUNTER — Encounter (HOSPITAL_COMMUNITY)
Admission: AD | Disposition: A | Discharge status: Home / Self Care | Payer: Self-pay | Source: Ambulatory Visit | Attending: Obstetrics & Gynecology

## 2015-05-30 ENCOUNTER — Inpatient Hospital Stay (HOSPITAL_COMMUNITY): Payer: 59 | Admitting: Registered Nurse

## 2015-05-30 ENCOUNTER — Encounter (HOSPITAL_COMMUNITY): Payer: Self-pay | Admitting: Registered Nurse

## 2015-05-30 LAB — RPR: RPR: NONREACTIVE

## 2015-05-30 SURGERY — Surgical Case
Anesthesia: Spinal

## 2015-05-30 MED ORDER — PROMETHAZINE HCL 25 MG/ML IJ SOLN
6.2500 mg | INTRAMUSCULAR | Status: DC | PRN
  Administered 2015-05-30: 12.5 mg via INTRAVENOUS

## 2015-05-30 MED ORDER — IBUPROFEN 600 MG PO TABS
600.0000 mg | ORAL_TABLET | Freq: Four times a day (QID) | ORAL | Status: DC
  Administered 2015-05-31 – 2015-06-01 (×6): 600 mg via ORAL
  Filled 2015-05-30 (×6): qty 1

## 2015-05-30 MED ORDER — SIMETHICONE 80 MG PO CHEW
80.0000 mg | CHEWABLE_TABLET | ORAL | Status: DC
  Administered 2015-05-31: 80 mg via ORAL
  Filled 2015-05-30: qty 1

## 2015-05-30 MED ORDER — OXYTOCIN 10 UNIT/ML IJ SOLN
40.0000 [IU] | INTRAVENOUS | Status: DC | PRN
  Administered 2015-05-30: 40 [IU] via INTRAVENOUS

## 2015-05-30 MED ORDER — PROMETHAZINE HCL 25 MG/ML IJ SOLN
INTRAMUSCULAR | Status: AC
  Filled 2015-05-30: qty 1

## 2015-05-30 MED ORDER — CEFOTETAN DISODIUM 2 G IJ SOLR
2.0000 g | Freq: Once | INTRAMUSCULAR | Status: AC
  Administered 2015-05-30: 2 g via INTRAVENOUS
  Filled 2015-05-30: qty 2

## 2015-05-30 MED ORDER — DIPHENHYDRAMINE HCL 25 MG PO CAPS: 25.0000 mg | ORAL_CAPSULE | ORAL | Status: DC | PRN

## 2015-05-30 MED ORDER — SENNOSIDES-DOCUSATE SODIUM 8.6-50 MG PO TABS
2.0000 | ORAL_TABLET | ORAL | Status: DC
  Administered 2015-05-31: 2 via ORAL
  Filled 2015-05-30: qty 2

## 2015-05-30 MED ORDER — ONDANSETRON HCL 4 MG/2ML IJ SOLN
INTRAMUSCULAR | Status: AC
  Filled 2015-05-30: qty 2

## 2015-05-30 MED ORDER — SODIUM CHLORIDE 0.9 % IJ SOLN: 3.0000 mL | INTRAMUSCULAR | Status: DC | PRN

## 2015-05-30 MED ORDER — DIPHENHYDRAMINE HCL 50 MG/ML IJ SOLN
12.5000 mg | INTRAMUSCULAR | Status: DC | PRN
Start: 2015-05-30 — End: 2015-06-01

## 2015-05-30 MED ORDER — PRENATAL MULTIVITAMIN CH
1.0000 | ORAL_TABLET | Freq: Every day | ORAL | Status: DC
  Administered 2015-05-31 – 2015-06-01 (×2): 1 via ORAL
  Filled 2015-05-30 (×2): qty 1

## 2015-05-30 MED ORDER — SCOPOLAMINE 1 MG/3DAYS TD PT72
MEDICATED_PATCH | TRANSDERMAL | Status: AC
  Filled 2015-05-30: qty 2

## 2015-05-30 MED ORDER — KETOROLAC TROMETHAMINE 30 MG/ML IJ SOLN
INTRAMUSCULAR | Status: AC
  Administered 2015-05-30: 30 mg
  Filled 2015-05-30: qty 1

## 2015-05-30 MED ORDER — NALBUPHINE HCL 10 MG/ML IJ SOLN
5.0000 mg | Freq: Once | INTRAMUSCULAR | Status: DC | PRN
Start: 2015-05-30 — End: 2015-06-01

## 2015-05-30 MED ORDER — TERBUTALINE SULFATE 1 MG/ML IJ SOLN: 0.2500 mg | Freq: Once | INTRAMUSCULAR | Status: DC | PRN

## 2015-05-30 MED ORDER — KETOROLAC TROMETHAMINE 30 MG/ML IJ SOLN: 30.0000 mg | Freq: Four times a day (QID) | INTRAMUSCULAR | Status: AC | PRN

## 2015-05-30 MED ORDER — DIPHENHYDRAMINE HCL 25 MG PO CAPS: 25.0000 mg | ORAL_CAPSULE | Freq: Four times a day (QID) | ORAL | Status: DC | PRN

## 2015-05-30 MED ORDER — OXYTOCIN 10 UNIT/ML IJ SOLN: 2.5000 [IU]/h | INTRAVENOUS | Status: AC

## 2015-05-30 MED ORDER — MORPHINE SULFATE (PF) 0.5 MG/ML IJ SOLN
INTRAMUSCULAR | Status: DC | PRN
  Administered 2015-05-30: .2 mg via EPIDURAL

## 2015-05-30 MED ORDER — LANOLIN HYDROUS EX OINT: 1.0000 "application " | TOPICAL_OINTMENT | CUTANEOUS | Status: DC | PRN

## 2015-05-30 MED ORDER — FENTANYL CITRATE (PF) 100 MCG/2ML IJ SOLN
INTRAMUSCULAR | Status: DC | PRN
  Administered 2015-05-30: 10 ug via INTRAVENOUS

## 2015-05-30 MED ORDER — ACETAMINOPHEN 325 MG PO TABS
650.0000 mg | ORAL_TABLET | ORAL | Status: DC | PRN
  Administered 2015-05-30: 650 mg via ORAL
  Filled 2015-05-30: qty 2

## 2015-05-30 MED ORDER — SODIUM CHLORIDE 0.9 % IR SOLN
Status: DC | PRN
  Administered 2015-05-30: 1

## 2015-05-30 MED ORDER — LACTATED RINGERS IV SOLN
INTRAVENOUS | Status: DC
  Administered 2015-05-31: 04:00:00 via INTRAVENOUS

## 2015-05-30 MED ORDER — OXYTOCIN 10 UNIT/ML IJ SOLN
1.0000 m[IU]/min | INTRAVENOUS | Status: DC
  Administered 2015-05-30: 2 m[IU]/min via INTRAVENOUS
  Filled 2015-05-30: qty 4

## 2015-05-30 MED ORDER — FENTANYL CITRATE (PF) 100 MCG/2ML IJ SOLN
INTRAMUSCULAR | Status: AC
  Filled 2015-05-30: qty 2

## 2015-05-30 MED ORDER — CEFAZOLIN SODIUM-DEXTROSE 2-3 GM-% IV SOLR
INTRAVENOUS | Status: AC
  Filled 2015-05-30: qty 200

## 2015-05-30 MED ORDER — NALBUPHINE HCL 10 MG/ML IJ SOLN: 5.0000 mg | INTRAMUSCULAR | Status: DC | PRN

## 2015-05-30 MED ORDER — NALBUPHINE HCL 10 MG/ML IJ SOLN: 5.0000 mg | Freq: Once | INTRAMUSCULAR | Status: DC | PRN

## 2015-05-30 MED ORDER — SCOPOLAMINE 1 MG/3DAYS TD PT72: 1.0000 | MEDICATED_PATCH | Freq: Once | TRANSDERMAL | Status: DC

## 2015-05-30 MED ORDER — SIMETHICONE 80 MG PO CHEW
80.0000 mg | CHEWABLE_TABLET | Freq: Three times a day (TID) | ORAL | Status: DC
  Administered 2015-05-31 – 2015-06-01 (×4): 80 mg via ORAL
  Filled 2015-05-30 (×4): qty 1

## 2015-05-30 MED ORDER — ZOLPIDEM TARTRATE 5 MG PO TABS: 5.0000 mg | ORAL_TABLET | Freq: Every evening | ORAL | Status: DC | PRN

## 2015-05-30 MED ORDER — ONDANSETRON HCL 4 MG/2ML IJ SOLN
INTRAMUSCULAR | Status: DC | PRN
  Administered 2015-05-30: 4 mg via INTRAVENOUS

## 2015-05-30 MED ORDER — IBUPROFEN 600 MG PO TABS: 600.0000 mg | ORAL_TABLET | Freq: Four times a day (QID) | ORAL | Status: DC | PRN

## 2015-05-30 MED ORDER — OXYTOCIN 10 UNIT/ML IJ SOLN
INTRAMUSCULAR | Status: AC
  Filled 2015-05-30: qty 4

## 2015-05-30 MED ORDER — PHENYLEPHRINE 8 MG IN D5W 100 ML (0.08MG/ML) PREMIX OPTIME
INJECTION | INTRAVENOUS | Status: DC | PRN
  Administered 2015-05-30: 60 ug/min via INTRAVENOUS

## 2015-05-30 MED ORDER — DEXTROSE 5 % IV SOLN: 1.0000 ug/kg/h | INTRAVENOUS | Status: DC | PRN

## 2015-05-30 MED ORDER — WITCH HAZEL-GLYCERIN EX PADS: 1.0000 "application " | MEDICATED_PAD | CUTANEOUS | Status: DC | PRN

## 2015-05-30 MED ORDER — TETANUS-DIPHTH-ACELL PERTUSSIS 5-2.5-18.5 LF-MCG/0.5 IM SUSP: 0.5000 mL | Freq: Once | INTRAMUSCULAR | Status: DC

## 2015-05-30 MED ORDER — NALOXONE HCL 0.4 MG/ML IJ SOLN: 0.4000 mg | INTRAMUSCULAR | Status: DC | PRN

## 2015-05-30 MED ORDER — ONDANSETRON HCL 4 MG/2ML IJ SOLN: 4.0000 mg | Freq: Three times a day (TID) | INTRAMUSCULAR | Status: DC | PRN

## 2015-05-30 MED ORDER — MEPERIDINE HCL 25 MG/ML IJ SOLN: 6.2500 mg | INTRAMUSCULAR | Status: DC | PRN

## 2015-05-30 MED ORDER — MORPHINE SULFATE (PF) 0.5 MG/ML IJ SOLN
INTRAMUSCULAR | Status: AC
  Filled 2015-05-30: qty 10

## 2015-05-30 MED ORDER — HYDROMORPHONE HCL 1 MG/ML IJ SOLN: 0.2500 mg | INTRAMUSCULAR | Status: DC | PRN

## 2015-05-30 MED ORDER — LACTATED RINGERS IV SOLN
INTRAVENOUS | Status: DC | PRN
  Administered 2015-05-30: 18:00:00 via INTRAVENOUS

## 2015-05-30 MED ORDER — SIMETHICONE 80 MG PO CHEW: 80.0000 mg | CHEWABLE_TABLET | ORAL | Status: DC | PRN

## 2015-05-30 MED ORDER — BUPIVACAINE IN DEXTROSE 0.75-8.25 % IT SOLN
INTRATHECAL | Status: DC | PRN
  Administered 2015-05-30: 1.6 mg via INTRATHECAL

## 2015-05-30 MED ORDER — DIBUCAINE 1 % RE OINT: 1.0000 "application " | TOPICAL_OINTMENT | RECTAL | Status: DC | PRN

## 2015-05-30 MED ORDER — SCOPOLAMINE 1 MG/3DAYS TD PT72
MEDICATED_PATCH | TRANSDERMAL | Status: DC | PRN
  Administered 2015-05-30: 1 via TRANSDERMAL

## 2015-05-30 MED ORDER — MENTHOL 3 MG MT LOZG: 1.0000 | LOZENGE | OROMUCOSAL | Status: DC | PRN

## 2015-05-30 SURGICAL SUPPLY — 30 items
CLAMP CORD UMBIL (MISCELLANEOUS) IMPLANT
CLOTH BEACON ORANGE TIMEOUT ST (SAFETY) ×3 IMPLANT
DRAPE SHEET LG 3/4 BI-LAMINATE (DRAPES) IMPLANT
DRSG OPSITE POSTOP 4X10 (GAUZE/BANDAGES/DRESSINGS) ×3 IMPLANT
DURAPREP 26ML APPLICATOR (WOUND CARE) ×3 IMPLANT
ELECT REM PT RETURN 9FT ADLT (ELECTROSURGICAL) ×3
ELECTRODE REM PT RTRN 9FT ADLT (ELECTROSURGICAL) ×1 IMPLANT
EXTRACTOR VACUUM M CUP 4 TUBE (SUCTIONS) IMPLANT
EXTRACTOR VACUUM M CUP 4' TUBE (SUCTIONS)
GLOVE BIOGEL PI IND STRL 7.0 (GLOVE) ×1 IMPLANT
GLOVE BIOGEL PI INDICATOR 7.0 (GLOVE) ×2
GLOVE SURG ORTHO 8.0 STRL STRW (GLOVE) ×3 IMPLANT
GOWN STRL REUS W/TWL LRG LVL3 (GOWN DISPOSABLE) ×6 IMPLANT
KIT ABG SYR 3ML LUER SLIP (SYRINGE) ×3 IMPLANT
NEEDLE HYPO 25X5/8 SAFETYGLIDE (NEEDLE) ×3 IMPLANT
NS IRRIG 1000ML POUR BTL (IV SOLUTION) ×3 IMPLANT
PACK C SECTION WH (CUSTOM PROCEDURE TRAY) ×3 IMPLANT
PAD OB MATERNITY 4.3X12.25 (PERSONAL CARE ITEMS) ×3 IMPLANT
PENCIL SMOKE EVAC W/HOLSTER (ELECTROSURGICAL) ×3 IMPLANT
RETRACTOR WND ALEXIS 25 LRG (MISCELLANEOUS) ×1 IMPLANT
RTRCTR WOUND ALEXIS 25CM LRG (MISCELLANEOUS) ×3
SUT MNCRL 0 VIOLET CTX 36 (SUTURE) ×4 IMPLANT
SUT MON AB 4-0 PS1 27 (SUTURE) ×6 IMPLANT
SUT MONOCRYL 0 CTX 36 (SUTURE) ×8
SUT PDS AB 1 CT  36 (SUTURE) ×2
SUT PDS AB 1 CT 36 (SUTURE) ×1 IMPLANT
SUT VIC AB 1 CTX 36 (SUTURE)
SUT VIC AB 1 CTX36XBRD ANBCTRL (SUTURE) IMPLANT
TOWEL OR 17X24 6PK STRL BLUE (TOWEL DISPOSABLE) ×3 IMPLANT
TRAY FOLEY CATH SILVER 14FR (SET/KITS/TRAYS/PACK) ×3 IMPLANT

## 2015-05-30 NOTE — Op Note (Signed)
Cesarean Section Procedure Note  Pre-operative Diagnosis: IUP at 40 weeks, Fetal intolerance to labor  Post-operative Diagnosis: same  Surgeon: Turner DanielsLOWE,Brynnan Rodenbaugh C   Assistants: Eure  Anesthesia: spinal  Procedure:  Low Segment Transverse cesarean section  Procedure Details  The patient was seen in the Holding Room. The risks, benefits, complications, treatment options, and expected outcomes were discussed with the patient.  The patient concurred with the proposed plan, giving informed consent.  The site of surgery properly noted/marked.. A Time Out was held and the above information confirmed.  After induction of anesthesia, the patient was draped and prepped in the usual sterile manner. A Pfannenstiel incision was made and carried down through the subcutaneous tissue to the fascia. Fascial incision was made and extended transversely. The fascia was separated from the underlying rectus tissue superiorly and inferiorly. The peritoneum was identified and entered. Peritoneal incision was extended longitudinally. The utero-vesical peritoneal reflection was incised transversely and the bladder flap was bluntly freed from the lower uterine segment. A low transverse uterine incision was made. Delivered from vertex presentation with Vacuum was a baby with Apgar scores of 8 at one minute and 9 at five minutes. After the umbilical cord was clamped and cut cord blood was obtained for evaluation. The placenta was removed intact and appeared normal. The uterine outline, tubes and ovaries appeared normal. The uterine incision was closed with running locked sutures of 0 monocryl and imbricated with 0 monocryl. Hemostasis was observed. Lavage was carried out until clear. The peritoneum was then closed with 0 monocryl and rectus muscles plicated in the midline.  After hemostasis was assured, the fascia was then reapproximated with running sutures of #1 PDSl. Irrigation was applied and after adequate hemostasis was  assured, the skin was reapproximated with subcutaneous sutures using 4-0 monocryl.  Instrument, sponge, and needle counts were correct prior the abdominal closure and at the conclusion of the case. The patient received 2 grams cefotetan preoperatively.  Findings: Viable  female  Estimated Blood Loss:  1000cc         Specimens: Placenta was sent to labor and delivery         Complications:  None

## 2015-05-30 NOTE — Progress Notes (Signed)
Patient ID: Ponciano OrtJennifer H Mckibbin, female   DOB: [birth year], 30 y.o.   MRN: [id number] CTSP for recurrent late decels with adequate labor by IUPC with mvu at 200-240 No appreciable cx change still 2 cm Fetal accels and late decels improved with pitocin off Plan Primary C/S for Fetal intolerance to labor Risks and benefits of C/S were discussed.  All questions were answered and informed consent was obtained.  Plan to proceed with low segment transverse Cesarean Section. DL

## 2015-05-30 NOTE — Progress Notes (Signed)
1ml Terbutaline administered IM per Dr. Langston MaskerMorris request.

## 2015-05-30 NOTE — Anesthesia Preprocedure Evaluation (Signed)
Anesthesia Evaluation  Patient identified by MRN, date of birth, ID band Patient awake    Reviewed: Allergy & Precautions, NPO status , Patient's Chart, lab work & pertinent test results  Airway Mallampati: II  TM Distance: >3 FB Neck ROM: Full    Dental no notable dental hx.    Pulmonary neg pulmonary ROS,    Pulmonary exam normal breath sounds clear to auscultation       Cardiovascular negative cardio ROS Normal cardiovascular exam Rhythm:Regular Rate:Normal     Neuro/Psych  Headaches, negative psych ROS   GI/Hepatic negative GI ROS, Neg liver ROS,   Endo/Other  Morbid obesity  Renal/GU negative Renal ROS  negative genitourinary   Musculoskeletal negative musculoskeletal ROS (+)   Abdominal   Peds negative pediatric ROS (+)  Hematology negative hematology ROS (+) anemia ,   Anesthesia Other Findings   Reproductive/Obstetrics negative OB ROS                             Anesthesia Physical Anesthesia Plan  ASA: III  Anesthesia Plan: Spinal   Post-op Pain Management:    Induction: Intravenous  Airway Management Planned: Natural Airway  Additional Equipment:   Intra-op Plan:   Post-operative Plan:   Informed Consent: I have reviewed the patients History and Physical, chart, labs and discussed the procedure including the risks, benefits and alternatives for the proposed anesthesia with the patient or authorized representative who has indicated his/her understanding and acceptance.   Dental advisory given  Plan Discussed with: CRNA  Anesthesia Plan Comments:         Anesthesia Quick Evaluation

## 2015-05-30 NOTE — Progress Notes (Signed)
Pt stated feeling shakey and heart racing. Informed pt this was common side effects of terbutaline. VS stable. Will continue to monitor

## 2015-05-30 NOTE — Anesthesia Procedure Notes (Signed)
Spinal Patient location during procedure: OB Start time: 05/30/2015 5:14 PM Staffing Anesthesiologist: Franne Grip Performed by: anesthesiologist  Preanesthetic Checklist Completed: patient identified, site marked, surgical consent, pre-op evaluation, timeout performed, IV checked, risks and benefits discussed and monitors and equipment checked Spinal Block Patient position: sitting Prep: Betadine and ChloraPrep Patient monitoring: heart rate, continuous pulse ox, blood pressure and cardiac monitor Approach: midline Location: L3-4 Injection technique: single-shot Needle Needle type: Pencan  Needle gauge: 24 G Needle length: 9 cm Needle insertion depth: 8 cm Additional Notes Expiration date of kit checked and confirmed. Patient tolerated procedure well, without complications. CSF clear. No paresthesias. Meaningful verbal contact maintained throughout block placement.

## 2015-05-30 NOTE — H&P (Signed)
Katelyn [name] is a 30 y.o. female presenting for IOL due to postdates.  Also had NST yesterday in office that was NR so admitted last night for cervical ripening.  Had prolonged decel last night but fetus recovered nicely.  preg uncomplicated.  GBS+. History OB History    Gravida Para Term Preterm AB TAB SAB Ectopic Multiple Living   1              Past Medical History  Diagnosis Date  . Anemia     prior to pregnancy   . Migraines    Past Surgical History  Procedure Laterality Date  . No surgery     Family History: family history includes Asthma in her sister; Breast cancer in her maternal grandmother and paternal grandmother; Diabetes in her maternal aunt and maternal uncle; Diabetes type I in her maternal aunt, maternal grandmother, and paternal grandmother; Heart failure in her father; Kidney disease in her maternal grandmother, maternal uncle, and paternal grandmother. Social History:  reports that she has never smoked. She has never used smokeless tobacco. She reports that she drinks alcohol. She reports that she does not use illicit drugs.   Prenatal Transfer Tool  Maternal Diabetes: No Genetic Screening: Normal Maternal Ultrasounds/Referrals: Normal Fetal Ultrasounds or other Referrals:  None Maternal Substance Abuse:  No Significant Maternal Medications:  None Significant Maternal Lab Results:  None Other Comments:  None  ROS  Dilation: 1 Effacement (%): 50 Station: -3 Exam by:: Dr Rana SnareLowe, MD Blood pressure 110/75, pulse 105, temperature 98.4 F (36.9 [name]), temperature source Oral, resp. rate 18, height 5\' 2"  (1.575 m), weight 263 lb (119.296 kg), last menstrual period 08/22/2014, SpO2 98 %. Exam Physical Exam  Prenatal labs: ABO, Rh: --/--/O POS, O POS (01/12 1750) Antibody: NEG (01/12 1750) Rubella: Nonimmune (06/06 0000) RPR: Non Reactive (01/12 1750)  HBsAg: Negative (06/06 0000)  HIV: Non-reactive (06/06 0000)  GBS: Positive (12/13 0000)    Assessment/Plan: IUP at 40 weeks for IOL S/P Cytotec now with AROM and Pit IV Abx for GBS   Katelyn [name] 05/30/2015, 8:51 AM

## 2015-05-30 NOTE — Transfer of Care (Signed)
Immediate Anesthesia Transfer of Care Note  Patient: Katelyn [name]  Procedure(s) Performed: Procedure(s): CESAREAN SECTION (N/A)  Patient Location: PACU  Anesthesia Type:Spinal  Level of Consciousness: awake, alert  and oriented  Airway & Oxygen Therapy: Patient Spontanous Breathing  Post-op Assessment: Report given to RN  Post vital signs: Reviewed  Last Vitals:  Filed Vitals:   05/30/15 1500 05/30/15 1616  BP: 109/66 101/67  Pulse: 77 100  Temp:  36.4 C  Resp: 18 18    Complications: No apparent anesthesia complications

## 2015-05-31 LAB — CBC
HEMATOCRIT: 30.8 % — AB (ref 36.0–46.0)
Hemoglobin: 10.1 g/dL — ABNORMAL LOW (ref 12.0–15.0)
MCH: 27.2 pg (ref 26.0–34.0)
MCHC: 32.8 g/dL (ref 30.0–36.0)
MCV: 83 fL (ref 78.0–100.0)
Platelets: 121 10*3/uL — ABNORMAL LOW (ref 150–400)
RBC: 3.71 MIL/uL — AB (ref 3.87–5.11)
RDW: 15.8 % — AB (ref 11.5–15.5)
WBC: 7.3 10*3/uL (ref 4.0–10.5)

## 2015-05-31 MED ORDER — OXYCODONE-ACETAMINOPHEN 5-325 MG PO TABS
1.0000 | ORAL_TABLET | ORAL | Status: DC | PRN
  Administered 2015-05-31: 1 via ORAL
  Filled 2015-05-31: qty 1

## 2015-05-31 NOTE — Lactation Note (Signed)
This note was copied from the chart of Katelyn Gala LewandowskyJennifer Razon. Lactation Consultation Note  Patient Name: Katelyn Gala LewandowskyJennifer Goede ZOXWR'UToday's Date: 05/31/2015 Reason for consult: Initial assessment Baby 19 hours old. Mom states that baby has been nursing well, but is sometimes sleepy at breast. Assisted mom to latch baby in football position to right breast. Mom able to hand express colostrum. Discussed having the baby STS. After several attempts, and demonstrating letting the baby suckle this LC's gloved finger, the baby latched deeply. Baby able to maintain a deep latch, and would suckle with stimulation. Baby had some swallows. Enc mom to offer lots of STS and attempts at breast. Enc FOB to assist with latching, especially with holding the baby's hands. Parents have had, and continue to have a lot of visitors. Enc mom to concentrate of BF.   [facility], aware of OP/BFSG and LC phone line assistance after D/C.   Maternal Data Has patient been taught Hand Expression?: Yes Does the patient have breastfeeding experience prior to this delivery?: No  Feeding Feeding Type: Breast Fed  LATCH Score/Interventions Latch: Repeated attempts needed to sustain latch, nipple held in mouth throughout feeding, stimulation needed to elicit sucking reflex. Intervention(s): Adjust position;Assist with latch;Breast compression  Audible Swallowing: A few with stimulation Intervention(s): Skin to skin;Hand expression  Type of Nipple: Everted at rest and after stimulation  Comfort (Breast/Nipple): Soft / non-tender     Hold (Positioning): Assistance needed to correctly position infant at breast and maintain latch.  LATCH Score: 7  Lactation Tools Discussed/Used     Consult Status Consult Status: Follow-up Date: 06/01/15 Follow-up type: In-patient    Geralynn OchsWILLIARD, Tracyann 05/31/2015, 12:41 PM

## 2015-05-31 NOTE — Anesthesia Postprocedure Evaluation (Signed)
Anesthesia Post Note  Patient: Katelyn [name]  Procedure(s) Performed: Procedure(s) (LRB): CESAREAN SECTION (N/A)  Patient location during evaluation: Mother Baby Anesthesia Type: Spinal Level of consciousness: awake, awake and alert, oriented and patient cooperative Pain management: pain level controlled Vital Signs Assessment: post-procedure vital signs reviewed and stable Respiratory status: spontaneous breathing, nonlabored ventilation and respiratory function stable Cardiovascular status: stable Postop Assessment: no headache, no backache, patient able to bend at knees and no signs of nausea or vomiting Anesthetic complications: no    Last Vitals:  Filed Vitals:   05/31/15 0339 05/31/15 0342  BP: 100/64 90/57  Pulse: 91 92  Temp:    Resp:      Last Pain:  Filed Vitals:   05/31/15 0615  PainSc: 5                  Jonah Nestle L

## 2015-05-31 NOTE — Addendum Note (Signed)
Addendum  created 05/31/15 0723 by Yolonda KidaAlison L Xochilth Standish, CRNA   Modules edited: Clinical Notes   Clinical Notes:  File: [id number]

## 2015-05-31 NOTE — Anesthesia Postprocedure Evaluation (Signed)
Anesthesia Post Note  Patient: Katelyn [name]  Procedure(s) Performed: Procedure(s) (LRB): CESAREAN SECTION (N/A)  Patient location during evaluation: PACU Anesthesia Type: Spinal Level of consciousness: oriented and awake and alert Pain management: pain level controlled Vital Signs Assessment: post-procedure vital signs reviewed and stable Respiratory status: spontaneous breathing, respiratory function stable and patient connected to nasal cannula oxygen Cardiovascular status: blood pressure returned to baseline and stable Postop Assessment: no headache, no backache and spinal receding Anesthetic complications: no    Last Vitals:  Filed Vitals:   05/31/15 0339 05/31/15 0342  BP: 100/64 90/57  Pulse: 91 92  Temp:    Resp:      Last Pain:  Filed Vitals:   05/31/15 0355  PainSc: 5                  Bronsen Serano J

## 2015-06-01 MED ORDER — OXYCODONE-ACETAMINOPHEN 5-325 MG PO TABS: 1.0000 | ORAL_TABLET | ORAL | Status: DC | PRN

## 2015-06-01 MED ORDER — IBUPROFEN 600 MG PO TABS: 600.0000 mg | ORAL_TABLET | Freq: Four times a day (QID) | ORAL | Status: DC

## 2015-06-01 NOTE — Lactation Note (Signed)
This note was copied from the chart of Girl Gala LewandowskyJennifer Beecham. Lactation Consultation Note  Patient Name: Girl Gala LewandowskyJennifer Moffit ZOXWR'UToday's Date: 06/01/2015 Reason for consult: Follow-up assessment  Baby 42 hours old. Parents report that baby nursed more often through the night and today. Mom reports that baby latching deeper and maintaining a deep latch. Enc mom to continue offering lots of STS and attempts at breast. Enc mom to hand express just prior to latching as well. Mom asked if she needed to start pumping. Discussed with mom that the baby is the best pump and as long as the baby is nursing well--every 2-3 hours for 15 or more minutes per feed--then she should concentrated on nursing the baby. Enc mom to call for assistance with latching or determining if baby swallowing. Pointed out to parents that baby seems satisfied and sleeping soundly in crib.  Maternal Data    Feeding Feeding Type: Breast Fed Length of feed: 15 min  LATCH Score/Interventions                      Lactation Tools Discussed/Used     Consult Status Consult Status: Follow-up Date: 06/02/15 Follow-up type: In-patient    Geralynn OchsWILLIARD, Consuella 06/01/2015, 11:47 AM

## 2015-06-01 NOTE — Discharge Summary (Signed)
Obstetric Discharge Summary Reason for Admission: induction of labor Prenatal Procedures: none Intrapartum Procedures: cesarean: low cervical, transverse Postpartum Procedures: none Complications-Operative and Postpartum: none HEMOGLOBIN  Date Value Ref Range Status  05/31/2015 10.1* 12.0 - 15.0 g/dL Final   HCT  Date Value Ref Range Status  05/31/2015 30.8* 36.0 - 46.0 % Final    Physical Exam:  General: alert, cooperative, appears stated age and no distress Lochia: appropriate Uterine Fundus: firm Incision: healing well DVT Evaluation: No evidence of DVT seen on physical exam.  Discharge Diagnoses: Term Pregnancy-delivered  Discharge Information: Date: 06/01/2015 Activity: pelvic rest Diet: routine Medications: Ibuprofen and Percocet Condition: stable Instructions: refer to practice specific booklet Discharge to: home   Newborn Data: Live born female  Birth Weight: 6 lb 13.4 oz (3100 g) APGAR: 8, 9  Home with mother.  Pope Brunty C 06/01/2015, 3:40 PM

## 2015-06-01 NOTE — Progress Notes (Signed)
Subjective: Postpartum Day 2: Cesarean Delivery Patient reports tolerating PO and no problems voiding.    Objective: Vital signs in last 24 hours: Temp:  [98.2 F (36.8 C)-98.6 F (37 C)] 98.2 F (36.8 C) (01/15 0523) Pulse Rate:  [75-91] 81 (01/15 0523) Resp:  [18-20] 18 (01/15 0523) BP: (96-108)/(59-67) 96/59 mmHg (01/15 0523) SpO2:  [100 %] 100 % (01/14 1700)  Physical Exam:  General: alert, cooperative, appears stated age and no distress Lochia: appropriate Uterine Fundus: firm Incision: healing well DVT Evaluation: No evidence of DVT seen on physical exam.   Recent Labs  05/29/15 1750 05/31/15 0545  HGB 12.6 10.1*  HCT 38.4 30.8*    Assessment/Plan: Status post Cesarean section. Doing well postoperatively.  Continue current care.  Avonna Iribe C 06/01/2015, 10:02 AM

## 2015-06-02 ENCOUNTER — Encounter (HOSPITAL_COMMUNITY): Payer: Self-pay | Admitting: Obstetrics and Gynecology

## 2015-06-03 ENCOUNTER — Inpatient Hospital Stay (HOSPITAL_COMMUNITY): Admission: RE | Admit: 2015-06-03 | Payer: 59 | Source: Ambulatory Visit

## 2015-06-05 ENCOUNTER — Ambulatory Visit (HOSPITAL_COMMUNITY)
Admission: RE | Admit: 2015-06-05 | Discharge: 2015-06-05 | Disposition: A | Discharge status: Home / Self Care | Payer: 59 | Source: Ambulatory Visit | Attending: Physician Assistant | Admitting: Physician Assistant

## 2015-06-05 DIAGNOSIS — O1495 Unspecified pre-eclampsia, complicating the puerperium: Secondary | ICD-10-CM | POA: Diagnosis not present

## 2015-06-05 DIAGNOSIS — R42 Dizziness and giddiness: Secondary | ICD-10-CM | POA: Diagnosis not present

## 2015-06-05 NOTE — Lactation Note (Addendum)
Lactation Consult  Mother's reason for visit: help to re-latch baby Visit Type:  Outpatient Appointment Notes:  See below Consult:  Initial Lactation Consultant:  Judee Clara ________________________________________________________________________  Katelyn [name] Name: Katelyn [name] Date of Birth: 05/30/2014 Pediatrician: [facility] Gender: female Gestational Age: [redacted]w[redacted]d (At Birth) Birth Weight: 6 lb 13.4 oz (3100 g) Weight at Discharge: Weight: 6 lb 6.7 oz (2910 g)Date of Discharge: 06/01/2015 Filed Weights   05/30/15 1729 05/30/15 2346 05/31/15 2335  Weight: 6 lb 13.4 oz (3100 g) 6 lb 11.4 oz (3045 g) 6 lb 6.7 oz (2910 g)  Weight       5 lbs 15 oz 06/02/15 Weight        6 lbs 0.5 oz 06/04/15 Weight today:2818 g 6 lbs 3.5 oz  Appt. NOTES: After baby's weight dropped by 12% on 06/02/15, Pediatrician recommended supplementation of 2 oz per feeding. Baby had been exclusively breast feeding.  Parents were very concerned and began solely bottle feeding baby formula with some expressed breast milk.  Mom did start double pumping with DEBP, and milk was slow to come in, only today obtaining 30 ml per pumping.  Now baby's weight is coming up, baby will not latch to the breast.  Assisted with this and baby fought any attempts to latch.  Initiated a 20 mm nipple shield, as a transition from bottle to breast.  Baby tense and pushed away from breast.  Initiated an SNS with 45 ml of formula (didn't have any expressed breast milk).  Placed SNS on right breast.  Finally baby relaxed and got into a good nutritive pattern.  Parents were very encouraged as baby had not fed this well for 3-4 days.  After 15 minutes, milk transfer was only 15 ml, and 15 ml was from SNS.  Parents are wanting to work with baby and the SNS at home, feeling encouraged about this tool assisting with baby returning to the breast.   Mom to pump both breasts after feedings 15-20 mins Save any EBM for use in SNS or  an occasional bottle.  Goal is to minimize bottles for supplementation, and increase milk supply in breasts.   Follow up appointment with Lactation 06/12/15  pm. To call prn for any assistance ________________________________________________________________________ Mother's Name: Katelyn [name] Type of delivery:  Cesarean Section Breastfeeding Experience:  none Maternal Medical Conditions:  none Maternal Medications:  PNV, Ibuprofen _______________________________________________________________________  Breastfeeding History (Post Discharge) Frequency of breastfeeding: none in 3 days Supplementation Formula:  Volume 60ml Frequency:  2-3 hrs Total volume per day:  18-20 oz per day       Brand: Similac Breastmilk:  Volume 30ml Frequency:  2-3 hrs Total volume per day:  Method:  Bottle Pumping both breasts every 2 hrs - obtaining 30 ml Infant Intake and Output Assessment Voids:  5 in 24 hrs.  Color:  Clear yellow Stools:  7 in 24 hrs.  Color:  Yellow _________________________________________________________________ Maternal Breast Assessment Breast:  Soft Nipple:  Erect Pain level:  0  No noticeable trauma _____________________________________________________________________

## 2015-06-07 ENCOUNTER — Encounter (HOSPITAL_COMMUNITY): Payer: Self-pay | Admitting: *Deleted

## 2015-06-07 ENCOUNTER — Inpatient Hospital Stay (HOSPITAL_COMMUNITY): Payer: 59

## 2015-06-07 ENCOUNTER — Inpatient Hospital Stay (HOSPITAL_COMMUNITY)
Admission: AD | Admit: 2015-06-07 | Discharge: 2015-06-09 | DRG: 776 | Disposition: A | Discharge status: Home / Self Care | Payer: 59 | Source: Ambulatory Visit | Attending: Obstetrics & Gynecology | Admitting: Obstetrics & Gynecology

## 2015-06-07 DIAGNOSIS — R0600 Dyspnea, unspecified: Secondary | ICD-10-CM

## 2015-06-07 DIAGNOSIS — Z8249 Family history of ischemic heart disease and other diseases of the circulatory system: Secondary | ICD-10-CM | POA: Diagnosis not present

## 2015-06-07 DIAGNOSIS — R42 Dizziness and giddiness: Secondary | ICD-10-CM | POA: Diagnosis present

## 2015-06-07 DIAGNOSIS — R0602 Shortness of breath: Secondary | ICD-10-CM

## 2015-06-07 DIAGNOSIS — O1495 Unspecified pre-eclampsia, complicating the puerperium: Secondary | ICD-10-CM | POA: Diagnosis present

## 2015-06-07 DIAGNOSIS — O1490 Unspecified pre-eclampsia, unspecified trimester: Secondary | ICD-10-CM | POA: Diagnosis present

## 2015-06-07 DIAGNOSIS — Z833 Family history of diabetes mellitus: Secondary | ICD-10-CM

## 2015-06-07 DIAGNOSIS — Z803 Family history of malignant neoplasm of breast: Secondary | ICD-10-CM

## 2015-06-07 LAB — URINE MICROSCOPIC-ADD ON: BACTERIA UA: NONE SEEN

## 2015-06-07 LAB — TYPE AND SCREEN
ABO/RH(D): O POS
ANTIBODY SCREEN: NEGATIVE

## 2015-06-07 LAB — COMPREHENSIVE METABOLIC PANEL
ALK PHOS: 105 U/L (ref 38–126)
ALT: 46 U/L (ref 14–54)
AST: 29 U/L (ref 15–41)
Albumin: 3 g/dL — ABNORMAL LOW (ref 3.5–5.0)
Anion gap: 9 (ref 5–15)
BUN: 10 mg/dL (ref 6–20)
CALCIUM: 8.8 mg/dL — AB (ref 8.9–10.3)
CO2: 26 mmol/L (ref 22–32)
CREATININE: 1.21 mg/dL — AB (ref 0.44–1.00)
Chloride: 107 mmol/L (ref 101–111)
Glucose, Bld: 96 mg/dL (ref 65–99)
Potassium: 3.6 mmol/L (ref 3.5–5.1)
Sodium: 142 mmol/L (ref 135–145)
Total Bilirubin: 0.5 mg/dL (ref 0.3–1.2)
Total Protein: 6.2 g/dL — ABNORMAL LOW (ref 6.5–8.1)

## 2015-06-07 LAB — URINALYSIS, ROUTINE W REFLEX MICROSCOPIC
BILIRUBIN URINE: NEGATIVE
Glucose, UA: NEGATIVE mg/dL
Ketones, ur: NEGATIVE mg/dL
Leukocytes, UA: NEGATIVE
NITRITE: NEGATIVE
Protein, ur: NEGATIVE mg/dL
Specific Gravity, Urine: 1.01 (ref 1.005–1.030)
pH: 5.5 (ref 5.0–8.0)

## 2015-06-07 LAB — CBC
HCT: 31.8 % — ABNORMAL LOW (ref 36.0–46.0)
HEMOGLOBIN: 10.4 g/dL — AB (ref 12.0–15.0)
MCH: 26.7 pg (ref 26.0–34.0)
MCHC: 32.7 g/dL (ref 30.0–36.0)
MCV: 81.7 fL (ref 78.0–100.0)
PLATELETS: 189 10*3/uL (ref 150–400)
RBC: 3.89 MIL/uL (ref 3.87–5.11)
RDW: 15 % (ref 11.5–15.5)
WBC: 4.4 10*3/uL (ref 4.0–10.5)

## 2015-06-07 LAB — PROTEIN / CREATININE RATIO, URINE
Creatinine, Urine: 34 mg/dL
Total Protein, Urine: <6 mg/dL

## 2015-06-07 LAB — TROPONIN I: Troponin I: 0.03 ng/mL (ref <0.031)

## 2015-06-07 LAB — CK TOTAL AND CKMB (NOT AT ARMC)
CK, MB: 2.7 ng/mL (ref 0.5–5.0)
Relative Index: 1.8 (ref 0.0–2.5)
Total CK: 153 U/L (ref 38–234)

## 2015-06-07 LAB — URIC ACID: URIC ACID, SERUM: 9.4 mg/dL — AB (ref 2.3–6.6)

## 2015-06-07 MED ORDER — LABETALOL HCL 5 MG/ML IV SOLN
20.0000 mg | INTRAVENOUS | Status: DC | PRN
  Administered 2015-06-07: 20 mg via INTRAVENOUS
  Administered 2015-06-07: 40 mg via INTRAVENOUS
  Filled 2015-06-07: qty 4
  Filled 2015-06-07: qty 8

## 2015-06-07 MED ORDER — DOCUSATE SODIUM 100 MG PO CAPS
100.0000 mg | ORAL_CAPSULE | Freq: Every day | ORAL | Status: DC
  Administered 2015-06-08: 100 mg via ORAL
  Filled 2015-06-07: qty 1

## 2015-06-07 MED ORDER — OXYCODONE-ACETAMINOPHEN 5-325 MG PO TABS
2.0000 | ORAL_TABLET | Freq: Once | ORAL | Status: AC
  Administered 2015-06-07: 2 via ORAL
  Filled 2015-06-07: qty 2

## 2015-06-07 MED ORDER — CALCIUM CARBONATE ANTACID 500 MG PO CHEW: 2.0000 | CHEWABLE_TABLET | ORAL | Status: DC | PRN

## 2015-06-07 MED ORDER — ENOXAPARIN SODIUM 120 MG/0.8ML ~~LOC~~ SOLN
1.0000 mg/kg | Freq: Once | SUBCUTANEOUS | Status: AC
  Administered 2015-06-07: 120 mg via SUBCUTANEOUS
  Filled 2015-06-07: qty 0.8

## 2015-06-07 MED ORDER — LACTATED RINGERS IV SOLN
INTRAVENOUS | Status: DC
  Administered 2015-06-07 – 2015-06-08 (×4): via INTRAVENOUS

## 2015-06-07 MED ORDER — HYDRALAZINE HCL 20 MG/ML IJ SOLN: 10.0000 mg | Freq: Once | INTRAMUSCULAR | Status: DC | PRN

## 2015-06-07 MED ORDER — MAGNESIUM SULFATE BOLUS VIA INFUSION
4.0000 g | Freq: Once | INTRAVENOUS | Status: AC
  Administered 2015-06-07: 4 g via INTRAVENOUS
  Filled 2015-06-07: qty 500

## 2015-06-07 MED ORDER — ZOLPIDEM TARTRATE 5 MG PO TABS: 5.0000 mg | ORAL_TABLET | Freq: Every evening | ORAL | Status: DC | PRN

## 2015-06-07 MED ORDER — PRENATAL MULTIVITAMIN CH
1.0000 | ORAL_TABLET | Freq: Every day | ORAL | Status: DC
  Administered 2015-06-08: 1 via ORAL
  Filled 2015-06-07: qty 1

## 2015-06-07 MED ORDER — IOHEXOL 350 MG/ML SOLN
100.0000 mL | Freq: Once | INTRAVENOUS | Status: AC | PRN
  Administered 2015-06-07: 100 mL via INTRAVENOUS

## 2015-06-07 MED ORDER — ACETAMINOPHEN 325 MG PO TABS
650.0000 mg | ORAL_TABLET | ORAL | Status: DC | PRN
  Administered 2015-06-07 – 2015-06-08 (×3): 650 mg via ORAL
  Filled 2015-06-07 (×3): qty 2

## 2015-06-07 MED ORDER — MAGNESIUM SULFATE 50 % IJ SOLN
1.0000 g/h | INTRAVENOUS | Status: DC
  Filled 2015-06-07: qty 80

## 2015-06-07 MED ORDER — FUROSEMIDE 40 MG PO TABS
40.0000 mg | ORAL_TABLET | Freq: Once | ORAL | Status: DC
  Filled 2015-06-07: qty 1

## 2015-06-07 MED ORDER — CYCLOBENZAPRINE HCL 10 MG PO TABS
10.0000 mg | ORAL_TABLET | Freq: Three times a day (TID) | ORAL | Status: DC | PRN
  Administered 2015-06-07 – 2015-06-08 (×2): 10 mg via ORAL
  Filled 2015-06-07 (×3): qty 1

## 2015-06-07 NOTE — MAU Note (Signed)
LR hung, IV not infusing, unable to give labetalol @ this time.  Will attempt another IV start.

## 2015-06-07 NOTE — MAU Note (Signed)
Pt back from XR, assisted to BR by [facility] for urine specimen.

## 2015-06-07 NOTE — MAU Note (Signed)
Pt no longer notably SOB, she states she is feeling better, breathing easier.  O2 sat 97-100% on 2 liters O2 by [place].  Family @ bedside.  Informed pt we are waiting on echo results.

## 2015-06-07 NOTE — MAU Provider Note (Signed)
History     CSN: [id number]  Arrival date and time: 06/07/15 1256   None     Chief Complaint  Patient presents with  . Shortness of Breath  . Dizziness   HPI 30 y.o. G1P1001 at 8 days s/p c-section w/ complaint of worsening dizziness, headaches and shortness of breath over the past few days. Shortness of breath at rest, no h/o asthma. Uncomplicated pregnancy, no PIH. Patient states she has been taking ibuprofen and percocet at home w/ no relief of headache.   Past Medical History  Diagnosis Date  . Anemia     prior to pregnancy   . Migraines     Past Surgical History  Procedure Laterality Date  . No surgery    . Cesarean section N/A 05/30/2015    Procedure: CESAREAN SECTION;  Surgeon: Candice Camp, MD;  Location: [facility];  Service: Obstetrics;  Laterality: N/A;    Family History  Problem Relation Age of Onset  . Breast cancer Paternal Grandmother   . Breast cancer Maternal Grandmother   . Kidney disease Paternal Grandmother   . Kidney disease Maternal Grandmother   . Kidney disease Maternal Uncle   . Diabetes type I Paternal Grandmother   . Diabetes type I Maternal Grandmother   . Diabetes type I Maternal Aunt   . Diabetes Maternal Aunt   . Diabetes Maternal Uncle   . Asthma Sister   . Heart failure Father     Social History  Substance Use Topics  . Smoking status: Never Smoker   . Smokeless tobacco: Never Used     Comment: casual smoker in college  . Alcohol Use: Yes     Comment: occasionally    Allergies: No Known Allergies  Prescriptions prior to admission  Medication Sig Dispense Refill Last Dose  . ibuprofen (ADVIL,MOTRIN) 600 MG tablet Take 1 tablet (600 mg total) by mouth every 6 (six) hours. 30 tablet 0 06/06/2015 at Unknown time  . oxyCODONE-acetaminophen (PERCOCET/ROXICET) 5-325 MG tablet Take 1-2 tablets by mouth every 4 (four) hours as needed for moderate pain (give 2 tablets for pain >7). 30 tablet 0 06/06/2015 at Unknown time  . Prenatal Vit-Fe  Fumarate-FA (PRENATAL MULTIVITAMIN) TABS tablet Take 1 tablet by mouth at bedtime.    06/07/2015 at Unknown time    Review of Systems  Constitutional: Negative.   Respiratory: Positive for shortness of breath. Negative for cough.   Cardiovascular: Positive for leg swelling. Negative for chest pain and palpitations.  Gastrointestinal: Negative for nausea, vomiting, abdominal pain, diarrhea and constipation.  Genitourinary: Negative for dysuria, urgency, frequency, hematuria and flank pain.       Negative for vaginal bleeding, vaginal discharge, dyspareunia  Musculoskeletal: Negative.   Neurological: Positive for dizziness and headaches. Negative for sensory change and loss of consciousness.  Psychiatric/Behavioral: Negative.    Physical Exam   Blood pressure 153/91, pulse 71, temperature 98.4 F (36.9 C), temperature source Oral, resp. rate 18, SpO2 100 %, unknown if currently breastfeeding.  Physical Exam  Nursing note and vitals reviewed. Constitutional: She is oriented to person, place, and time. She appears well-developed and well-nourished.  HENT:  Head: Normocephalic and atraumatic.  Cardiovascular: Normal rate, regular rhythm and normal heart sounds.   Respiratory: She has decreased breath sounds in the right lower field. She has wheezes in the right upper field.  Short of breath at rest, worsens w/ speaking  GI: Soft. There is no tenderness.  Incision healing  Musculoskeletal: She exhibits edema (BLE).  Neurological: She is alert and oriented to person, place, and time. Abnormal reflex: brisk patellar reflexes. No cranial nerve deficit.  No clonus   Skin: Skin is warm and dry.  Psychiatric: She has a normal mood and affect.    MAU Course  Procedures Dr. Langston Masker notified of patient status, orders for labs, chest x-ray, echo entered, Dr. Langston Masker to MAU to see patient  Results for orders placed or performed during the hospital encounter of 06/07/15 (from the past 24 hour(s))   CBC     Status: Abnormal   Collection Time: 06/07/15  1:30 PM  Result Value Ref Range   WBC 4.4 4.0 - 10.5 K/uL   RBC 3.89 3.87 - 5.11 MIL/uL   Hemoglobin 10.4 (L) 12.0 - 15.0 g/dL   HCT 16.1 (L) 09.6 - 04.5 %   MCV 81.7 78.0 - 100.0 fL   MCH 26.7 26.0 - 34.0 pg   MCHC 32.7 30.0 - 36.0 g/dL   RDW 40.9 81.1 - 91.4 %   Platelets 189 150 - 400 K/uL  Comprehensive metabolic panel     Status: Abnormal   Collection Time: 06/07/15  1:30 PM  Result Value Ref Range   Sodium 142 135 - 145 mmol/L   Potassium 3.6 3.5 - 5.1 mmol/L   Chloride 107 101 - 111 mmol/L   CO2 26 22 - 32 mmol/L   Glucose, Bld 96 65 - 99 mg/dL   BUN 10 6 - 20 mg/dL   Creatinine, Ser 7.82 (H) 0.44 - 1.00 mg/dL   Calcium 8.8 (L) 8.9 - 10.3 mg/dL   Total Protein 6.2 (L) 6.5 - 8.1 g/dL   Albumin 3.0 (L) 3.5 - 5.0 g/dL   AST 29 15 - 41 U/L   ALT 46 14 - 54 U/L   Alkaline Phosphatase 105 38 - 126 U/L   Total Bilirubin 0.5 0.3 - 1.2 mg/dL   GFR calc non Af Amer >60 >60 mL/min   GFR calc Af Amer >60 >60 mL/min   Anion gap 9 5 - 15  Uric acid     Status: Abnormal   Collection Time: 06/07/15  1:30 PM  Result Value Ref Range   Uric Acid, Serum 9.4 (H) 2.3 - 6.6 mg/dL  Urinalysis, Routine w reflex microscopic (not at [facility])     Status: Abnormal   Collection Time: 06/07/15  1:55 PM  Result Value Ref Range   Color, Urine YELLOW YELLOW   APPearance CLEAR CLEAR   Specific Gravity, Urine 1.010 1.005 - 1.030   pH 5.5 5.0 - 8.0   Glucose, UA NEGATIVE NEGATIVE mg/dL   Hgb urine dipstick MODERATE (A) NEGATIVE   Bilirubin Urine NEGATIVE NEGATIVE   Ketones, ur NEGATIVE NEGATIVE mg/dL   Protein, ur NEGATIVE NEGATIVE mg/dL   Nitrite NEGATIVE NEGATIVE   Leukocytes, UA NEGATIVE NEGATIVE  Protein / creatinine ratio, urine     Status: None   Collection Time: 06/07/15  1:55 PM  Result Value Ref Range   Creatinine, Urine 34.00 mg/dL   Total Protein, Urine <6 mg/dL   Protein Creatinine Ratio        0.00 - 0.15 mg/mg[Cre]  Urine  microscopic-add on     Status: Abnormal   Collection Time: 06/07/15  1:55 PM  Result Value Ref Range   Squamous Epithelial / LPF 0-5 (A) NONE SEEN   WBC, UA 0-5 0 - 5 WBC/hpf   RBC / HPF 0-5 0 - 5 RBC/hpf   Bacteria, UA NONE SEEN NONE  SEEN   Temp:  [98.4 F (36.9 C)] 98.4 F (36.9 C) (01/21 1306) Pulse Rate:  [66-88] 71 (01/21 1833) Resp:  [18-24] 18 (01/21 1533) BP: (133-169)/(81-97) 153/91 mmHg (01/21 1833) SpO2:  [90 %-100 %] 100 % (01/21 1747)   Assessment and Plan  Dr. Langston Masker assumed care of patient, to be admitted   [facility] 06/07/2015, 7:26 PM

## 2015-06-07 NOTE — MAU Note (Signed)
Pt had C/S on 1/13, started feeling SOB & dizzy two days ago, has become progressively worse.  Pt denies asthma or any URI, did not have hypertension during pregnancy.  Appears SOB @ rest.

## 2015-06-07 NOTE — MAU Note (Signed)
IV now working, flushed easily, LR infusing.

## 2015-06-07 NOTE — Progress Notes (Signed)
Called MD regarding echocardiogram results.  MD states she has paged cardiologist on call & has not received a call back.

## 2015-06-07 NOTE — MAU Note (Signed)
Lab in drawing blood, respiratory here for EKG.

## 2015-06-07 NOTE — H&P (Signed)
Katelyn [name] is an 30 y.o. female presenting to MAU POD#8 s/p uncomplicated C/S for fetal intolerance to labor with SOB and dizziness.  The patient had an uncomplicated pregnancy and postoperative course (normal BPs) and was discharged home on POD#2. The patients reports starting to feel dizzy and SOB on Monday and thought she was getting a URI. Her symptoms gradually worsened until today she was unable to hold and breastfeed her newborn.  On questioning, she reports generalized back, shoulders, and neck pain which has been present since delivery.  She also reports HA since Monday; worsening.  She states she had no edema throughout the pregnancy but has developed bilateral lower and upper extremity in the last few days.    Pertinent Gynecological History: Menses: n/a Bleeding: n/a Contraception: none DES exposure: unknown Blood transfusions: none Sexually transmitted diseases: no past history Previous GYN Procedures: C/S x 1  Last mammogram: n/a Date: n/a Last pap: n/a Date: n/a OB History: G1, P1   Menstrual History: Menarche age: n/a No LMP recorded.    Past Medical History  Diagnosis Date  . Anemia     prior to pregnancy   . Migraines     Past Surgical History  Procedure Laterality Date  . No surgery    . Cesarean section N/A 05/30/2015    Procedure: CESAREAN SECTION;  Surgeon: Candice Camp, MD;  Location: [facility];  Service: Obstetrics;  Laterality: N/A;    Family History  Problem Relation Age of Onset  . Breast cancer Paternal Grandmother   . Breast cancer Maternal Grandmother   . Kidney disease Paternal Grandmother   . Kidney disease Maternal Grandmother   . Kidney disease Maternal Uncle   . Diabetes type I Paternal Grandmother   . Diabetes type I Maternal Grandmother   . Diabetes type I Maternal Aunt   . Diabetes Maternal Aunt   . Diabetes Maternal Uncle   . Asthma Sister   . Heart failure Father     Social History:  reports that she has never smoked. She has  never used smokeless tobacco. She reports that she drinks alcohol. She reports that she does not use illicit drugs.  Allergies: No Known Allergies  Prescriptions prior to admission  Medication Sig Dispense Refill Last Dose  . ibuprofen (ADVIL,MOTRIN) 600 MG tablet Take 1 tablet (600 mg total) by mouth every 6 (six) hours. 30 tablet 0 06/06/2015 at Unknown time  . oxyCODONE-acetaminophen (PERCOCET/ROXICET) 5-325 MG tablet Take 1-2 tablets by mouth every 4 (four) hours as needed for moderate pain (give 2 tablets for pain >7). 30 tablet 0 06/06/2015 at Unknown time  . Prenatal Vit-Fe Fumarate-FA (PRENATAL MULTIVITAMIN) TABS tablet Take 1 tablet by mouth at bedtime.    06/07/2015 at Unknown time    Review of Systems  Constitutional: Negative for fever and chills.  HENT: Negative for congestion, hearing loss and sore throat.   Eyes: Negative for blurred vision.  Respiratory: Positive for shortness of breath. Negative for cough.   Cardiovascular: Positive for leg swelling. Negative for chest pain.  Gastrointestinal: Negative for nausea, vomiting and abdominal pain.  Musculoskeletal: Positive for back pain.  Neurological: Positive for dizziness and headaches.    Blood pressure 144/85, pulse 78, temperature 98.4 F (36.9 C), temperature source Oral, resp. rate 18, SpO2 100 %, unknown if currently breastfeeding. Physical Exam  Constitutional: She is oriented to person, place, and time. She appears well-developed and well-nourished.  HENT:  Head: Normocephalic and atraumatic.  Neck: Normal range of  motion.  Cardiovascular: Normal rate, regular rhythm and normal heart sounds.   Respiratory: Breath sounds normal. She is in respiratory distress. She exhibits no tenderness.  GI: Soft. There is no rebound and no guarding.  Neurological: She is alert and oriented to person, place, and time.  Skin: Skin is warm and dry.  Psychiatric: She has a normal mood and affect. Her behavior is normal.  Ext: 2+  edema, 1+DTRs, no clonus MSK: Muscular tension and tenderness from posterior neck to sacrum.  No skin changes.  Results for orders placed or performed during the hospital encounter of 06/07/15 (from the past 24 hour(s))  CBC     Status: Abnormal   Collection Time: 06/07/15  1:30 PM  Result Value Ref Range   WBC 4.4 4.0 - 10.5 K/uL   RBC 3.89 3.87 - 5.11 MIL/uL   Hemoglobin 10.4 (L) 12.0 - 15.0 g/dL   HCT 21.3 (L) 08.6 - 57.8 %   MCV 81.7 78.0 - 100.0 fL   MCH 26.7 26.0 - 34.0 pg   MCHC 32.7 30.0 - 36.0 g/dL   RDW 46.9 62.9 - 52.8 %   Platelets 189 150 - 400 K/uL  Comprehensive metabolic panel     Status: Abnormal   Collection Time: 06/07/15  1:30 PM  Result Value Ref Range   Sodium 142 135 - 145 mmol/L   Potassium 3.6 3.5 - 5.1 mmol/L   Chloride 107 101 - 111 mmol/L   CO2 26 22 - 32 mmol/L   Glucose, Bld 96 65 - 99 mg/dL   BUN 10 6 - 20 mg/dL   Creatinine, Ser 4.13 (H) 0.44 - 1.00 mg/dL   Calcium 8.8 (L) 8.9 - 10.3 mg/dL   Total Protein 6.2 (L) 6.5 - 8.1 g/dL   Albumin 3.0 (L) 3.5 - 5.0 g/dL   AST 29 15 - 41 U/L   ALT 46 14 - 54 U/L   Alkaline Phosphatase 105 38 - 126 U/L   Total Bilirubin 0.5 0.3 - 1.2 mg/dL   GFR calc non Af Amer >60 >60 mL/min   GFR calc Af Amer >60 >60 mL/min   Anion gap 9 5 - 15  Uric acid     Status: Abnormal   Collection Time: 06/07/15  1:30 PM  Result Value Ref Range   Uric Acid, Serum 9.4 (H) 2.3 - 6.6 mg/dL  Urinalysis, Routine w reflex microscopic (not at [facility])     Status: Abnormal   Collection Time: 06/07/15  1:55 PM  Result Value Ref Range   Color, Urine YELLOW YELLOW   APPearance CLEAR CLEAR   Specific Gravity, Urine 1.010 1.005 - 1.030   pH 5.5 5.0 - 8.0   Glucose, UA NEGATIVE NEGATIVE mg/dL   Hgb urine dipstick MODERATE (A) NEGATIVE   Bilirubin Urine NEGATIVE NEGATIVE   Ketones, ur NEGATIVE NEGATIVE mg/dL   Protein, ur NEGATIVE NEGATIVE mg/dL   Nitrite NEGATIVE NEGATIVE   Leukocytes, UA NEGATIVE NEGATIVE  Protein / creatinine  ratio, urine     Status: None   Collection Time: 06/07/15  1:55 PM  Result Value Ref Range   Creatinine, Urine 34.00 mg/dL   Total Protein, Urine <6 mg/dL   Protein Creatinine Ratio        0.00 - 0.15 mg/mg[Cre]  Urine microscopic-add on     Status: Abnormal   Collection Time: 06/07/15  1:55 PM  Result Value Ref Range   Squamous Epithelial / LPF 0-5 (A) NONE SEEN   WBC, UA  0-5 0 - 5 WBC/hpf   RBC / HPF 0-5 0 - 5 RBC/hpf   Bacteria, UA NONE SEEN NONE SEEN    Dg Chest 2 View  06/07/2015  CLINICAL DATA:  Shortness of breath, dizziness for 2 days EXAM: CHEST  2 VIEW COMPARISON:  05/03/2011 FINDINGS: Cardiomediastinal silhouette is stable. No pulmonary edema. Streaky bilateral basilar atelectasis or early infiltrate. Bony thorax is unremarkable. IMPRESSION: No pulmonary edema. Streaky bilateral basilar atelectasis or early infiltrate. Electronically Signed   By: Natasha Mead M.D.   On: 06/07/2015 14:21   Ct Angio Chest Pe W/cm &/or Wo Cm  06/07/2015  CLINICAL DATA:  Shortness of breath, dizziness for 2 days,, hypertension during pregnancy EXAM: CT ANGIOGRAPHY CHEST WITH CONTRAST TECHNIQUE: Multidetector CT imaging of the chest was performed using the standard protocol during bolus administration of intravenous contrast. Multiplanar CT image reconstructions and MIPs were obtained to evaluate the vascular anatomy. CONTRAST:  OMNIPAQUE IOHEXOL 350 MG/ML SOLN COMPARISON:  None. FINDINGS: Images of the thoracic inlet are unremarkable. Central thoracic aorta is unremarkable. Heart size is within normal limits. No mediastinal hematoma or adenopathy. There are streaky artifacts from patient's large body habitus. The study is of excellent technical quality. There is no evidence of pulmonary embolus. There is no hilar adenopathy. Bilateral small pleural effusion. Bilateral lower lobe posterior atelectasis. No segmental infiltrate or pulmonary edema. No destructive rib lesions are noted. Sagittal images of  the spine are unremarkable. The visualized upper abdomen is unremarkable. Review of the MIP images confirms the above findings. IMPRESSION: 1. No pulmonary embolus is noted. 2. No acute infiltrate or pulmonary edema. Bilateral small pleural effusion. Bilateral lower lobe posterior atelectasis. 3. No mediastinal hematoma or adenopathy. 4. No destructive bony lesions are noted. Electronically Signed   By: Natasha Mead M.D.   On: 06/07/2015 18:22    Assessment/Plan: 29yo G1P1 POD#8 with pre-eclampsia -Mag 4/1 (Cr 1.2) -Repeat labs in AM -Treat BPs as needed -Flexeril for MSK pain  Nyeema Want 06/07/2015, 8:03 PM

## 2015-06-07 NOTE — Progress Notes (Signed)
  Echocardiogram 2D Echocardiogram has been performed.  Katelyn [name] 06/07/2015, 4:13 PM

## 2015-06-07 NOTE — MAU Note (Signed)
Echocardiogram in process

## 2015-06-07 NOTE — MAU Note (Signed)
Vascular tech @ [facility] paged.

## 2015-06-07 NOTE — MAU Note (Signed)
Pt back from CT

## 2015-06-08 DIAGNOSIS — O1495 Unspecified pre-eclampsia, complicating the puerperium: Secondary | ICD-10-CM | POA: Diagnosis present

## 2015-06-08 LAB — COMPREHENSIVE METABOLIC PANEL
ALT: 42 U/L (ref 14–54)
AST: 23 U/L (ref 15–41)
Albumin: 2.8 g/dL — ABNORMAL LOW (ref 3.5–5.0)
Alkaline Phosphatase: 96 U/L (ref 38–126)
Anion gap: 11 (ref 5–15)
BUN: 7 mg/dL (ref 6–20)
CHLORIDE: 106 mmol/L (ref 101–111)
CO2: 26 mmol/L (ref 22–32)
Calcium: 8.4 mg/dL — ABNORMAL LOW (ref 8.9–10.3)
Creatinine, Ser: 1.15 mg/dL — ABNORMAL HIGH (ref 0.44–1.00)
GFR calc Af Amer: >60 mL/min (ref >60)
Glucose, Bld: 83 mg/dL (ref 65–99)
POTASSIUM: 3.9 mmol/L (ref 3.5–5.1)
Sodium: 143 mmol/L (ref 135–145)
Total Bilirubin: 0.5 mg/dL (ref 0.3–1.2)
Total Protein: 5.7 g/dL — ABNORMAL LOW (ref 6.5–8.1)

## 2015-06-08 LAB — CBC
HEMATOCRIT: 29.4 % — AB (ref 36.0–46.0)
Hemoglobin: 9.5 g/dL — ABNORMAL LOW (ref 12.0–15.0)
MCH: 26.5 pg (ref 26.0–34.0)
MCHC: 32.3 g/dL (ref 30.0–36.0)
MCV: 82.1 fL (ref 78.0–100.0)
Platelets: 183 10*3/uL (ref 150–400)
RBC: 3.58 MIL/uL — AB (ref 3.87–5.11)
RDW: 15.2 % (ref 11.5–15.5)
WBC: 4.4 10*3/uL (ref 4.0–10.5)

## 2015-06-08 LAB — BRAIN NATRIURETIC PEPTIDE: B NATRIURETIC PEPTIDE 5: 540.9 pg/mL — AB (ref 0.0–100.0)

## 2015-06-08 MED ORDER — LABETALOL HCL 100 MG PO TABS
200.0000 mg | ORAL_TABLET | Freq: Three times a day (TID) | ORAL | Status: DC
  Administered 2015-06-08: 200 mg via ORAL
  Filled 2015-06-08: qty 2

## 2015-06-08 MED ORDER — LABETALOL HCL 300 MG PO TABS
400.0000 mg | ORAL_TABLET | Freq: Three times a day (TID) | ORAL | Status: DC
  Administered 2015-06-08 – 2015-06-09 (×2): 400 mg via ORAL
  Filled 2015-06-08 (×2): qty 1

## 2015-06-08 NOTE — Progress Notes (Addendum)
No current c/o.  Patient had HA earlier but improved with Tylenol.  No SOB.  No visual disturbance or RUQ pain.  On magnesium sulfate ppx since 730pm last night.  No anti-hypertensives required since magnesium was started.    VSS. BPs in nl-mild range.   Gen: A&O x 3, no distress Abd: soft, NT, well-healing incision Ext: 1+ edema, 1+ DTRs and no clonus Labs wnl except Cr 1.21->1.15 today  29yo POD#9 with postpartum pre-eclampsia -Continue 24 hours of magnesium sulfate -Will add labetalol 200 q 8 as most BPs are high mild range -Likely D/C home tomorrow once of magnesium for several hours  Mitchel Honour, DO

## 2015-06-08 NOTE — Lactation Note (Signed)
Lactation Consultation Note LC visit with mom who has been readmitted.  Mom reports she is pumping about every 3 hours and family is giving EBM to baby.  Mom reports being concerned about baby getting breastmilk with mom taking medications but reports she has been told they are ok.  Mom knows to call for assist as needed.   Patient Name: Katelyn [name] Date: 06/08/2015     Maternal Data    Feeding    LATCH Score/Interventions                      Lactation Tools Discussed/Used     Consult Status      Shoptaw, Arvella Merles 06/08/2015, 10:16 PM

## 2015-06-09 LAB — COMPREHENSIVE METABOLIC PANEL
ALBUMIN: 3 g/dL — AB (ref 3.5–5.0)
ALK PHOS: 103 U/L (ref 38–126)
ALT: 32 U/L (ref 14–54)
ANION GAP: 9 (ref 5–15)
AST: 18 U/L (ref 15–41)
BILIRUBIN TOTAL: 0.4 mg/dL (ref 0.3–1.2)
BUN: 7 mg/dL (ref 6–20)
CO2: 28 mmol/L (ref 22–32)
Calcium: 8.5 mg/dL — ABNORMAL LOW (ref 8.9–10.3)
Chloride: 105 mmol/L (ref 101–111)
Creatinine, Ser: 1.19 mg/dL — ABNORMAL HIGH (ref 0.44–1.00)
Glucose, Bld: 96 mg/dL (ref 65–99)
POTASSIUM: 3.9 mmol/L (ref 3.5–5.1)
Sodium: 142 mmol/L (ref 135–145)
Total Protein: 5.9 g/dL — ABNORMAL LOW (ref 6.5–8.1)

## 2015-06-09 LAB — CBC
HEMATOCRIT: 31.2 % — AB (ref 36.0–46.0)
HEMOGLOBIN: 10 g/dL — AB (ref 12.0–15.0)
MCH: 26.4 pg (ref 26.0–34.0)
MCHC: 32.1 g/dL (ref 30.0–36.0)
MCV: 82.3 fL (ref 78.0–100.0)
Platelets: 209 10*3/uL (ref 150–400)
RBC: 3.79 MIL/uL — ABNORMAL LOW (ref 3.87–5.11)
RDW: 15.4 % (ref 11.5–15.5)
WBC: 4.2 10*3/uL (ref 4.0–10.5)

## 2015-06-09 MED ORDER — CYCLOBENZAPRINE HCL 10 MG PO TABS: 10.0000 mg | ORAL_TABLET | Freq: Three times a day (TID) | ORAL | Status: DC | PRN

## 2015-06-09 MED ORDER — HYDROCHLOROTHIAZIDE 12.5 MG PO TABS: 25.0000 mg | ORAL_TABLET | Freq: Every day | ORAL | Status: DC

## 2015-06-09 MED ORDER — LABETALOL HCL 200 MG PO TABS: 800.0000 mg | ORAL_TABLET | Freq: Three times a day (TID) | ORAL | Status: DC

## 2015-06-09 NOTE — Discharge Summary (Signed)
Physician Discharge Summary  Patient ID: Katelyn [name] MRN: [id number] DOB/AGE: 10-11-1985 29 y.o.  Admit date: 06/07/2015 Discharge date: 06/09/2015  Admission Diagnoses:PP pre-Eclampsia  Discharge Diagnoses: same Active Problems:   Preeclampsia   Pre-eclampsia, postpartum   Discharged Condition: good  Hospital Course: adm for PP Pre -E with edema + SOB>>>Chest CT NEG/ECHO and CXR neg, adm for 24 hr PP MgSO4 and diuresis>>>good response to PO labetalol with improvement osf sx  Consults: None  Significant Diagnostic Studies: Chest CT NEG  ECHO  WNL  Treatments: MGSO4/diuresis/antihypertensive  Discharge Exam: Blood pressure 150/83, pulse 72, temperature 98.2 F (36.8 C), temperature source Oral, resp. rate 17, height  (1.575 m), weight 263 lb 3.2 oz (119.387 kg), SpO2 95 %, unknown if currently breastfeeding. Resp: clear to auscultation bilaterally Extremities: only trace edema Incision/Wound:C/D  Disposition: 01-Home or Self Care     Medication List    STOP taking these medications        ibuprofen 600 MG tablet  Commonly known as:  ADVIL,MOTRIN     oxyCODONE-acetaminophen 5-325 MG tablet  Commonly known as:  PERCOCET/ROXICET      TAKE these medications        cyclobenzaprine 10 MG tablet  Commonly known as:  FLEXERIL  Take 1 tablet (10 mg total) by mouth 3 (three) times daily as needed for muscle spasms.     hydrochlorothiazide 12.5 MG tablet  Commonly known as:  HYDRODIURIL  Take 2 tablets (25 mg total) by mouth daily.     labetalol 200 MG tablet  Commonly known as:  NORMODYNE  Take 4 tablets (800 mg total) by mouth 3 (three) times daily.     prenatal multivitamin Tabs tablet  Take 1 tablet by mouth at bedtime.           Follow-up Information    Follow up with Meriel Pica, MD In 3 days.   Specialty:  Obstetrics and Gynecology   Why:  office will call   Contact information:   [address] [place]  [address] [phone]       Signed: Meriel Pica 06/09/2015, 8:17 AM

## 2015-06-09 NOTE — Discharge Instructions (Signed)
Postpartum Hypertension  Postpartum hypertension is high blood pressure after pregnancy that remains higher than normal for more than two days after delivery. You may not realize that you have postpartum hypertension if your blood pressure is not being checked regularly. In some cases, postpartum hypertension will go away on its own, usually within a week of delivery. However, for some women, medical treatment is required to prevent serious complications, such as seizures or stroke.  The following things can affect your blood pressure:  · The type of delivery you had.  · Having received IV fluids or other medicines during or after delivery.  CAUSES   Postpartum hypertension may be caused by any of the following or by a combination of any of the following:  · Hypertension that existed before pregnancy (chronic hypertension).  · Gestational hypertension.  · Preeclampsia or eclampsia.  · Receiving a lot of fluid through an IV during or after delivery.  · Medicines.  · HELLP syndrome.  · Hyperthyroidism.  · Stroke.  · Other rare neurological or blood disorders.  In some cases, the cause may not be known.  RISK FACTORS  Postpartum hypertension can be related to one or more risk factors, such as:  · Chronic hypertension. In some cases, this may not have been diagnosed before pregnancy.  · Obesity.  · Type 2 diabetes.  · Kidney disease.  · Family history of preeclampsia.  · Other medical conditions that cause hormonal imbalances.  SIGNS AND SYMPTOMS  As with all types of hypertension, postpartum hypertension may not have any symptoms. Depending on how high your blood pressure is, you may experience:  · Headaches. These may be mild, moderate, or severe. They may also be steady, constant, or sudden in onset (thunderclap headache).  · Visual changes.  · Dizziness.  · Shortness of breath.  · Swelling of your hands, feet, lower legs, or face. In some cases, you may have swelling in more than one of these locations.  · Heart  palpitations or a racing heartbeat.  · Difficulty breathing while lying down.  · Decreased urination.  Other rare signs and symptoms may include:  · Sweating more than usual. This lasts longer than a few days after delivery.  · Chest pain.  · Sudden dizziness when you get up from sitting or lying down.  · Seizures.  · Nausea or vomiting.  · Abdominal pain.  DIAGNOSIS  The diagnosis of postpartum hypertension is made through a combination of physical examination findings and testing of your blood and urine. You may also have additional tests, such as a CT scan or an MRI, to check for other complications of postpartum hypertension.  TREATMENT  When blood pressure is high enough to require treatment, your options may include:  · Medicines to reduce blood pressure (antihypertensives). Tell your health care provider if you are breastfeeding or if you plan to breastfeed. There are many antihypertensive medicines that are safe to take while breastfeeding.  · Stopping medicines that may be causing hypertension.  · Treating medical conditions that are causing hypertension.  · Treating the complications of hypertension, such as seizures, stroke, or kidney problems.  Your health care provider will also continue to monitor your blood pressure closely and repeatedly until it is within a safe range for you.   HOME CARE INSTRUCTIONS  · Take medicines only as directed by your health care provider.  · Get regular exercise after your health care provider tells you that it is safe.  · Follow   your health care provider's recommendations on fluid and salt restrictions.  · Do not use any tobacco products, including cigarettes, chewing tobacco, or electronic cigarettes. If you need help quitting, ask your health care provider.  · Keep all follow-up visits as directed by your health care provider. This is important.  SEEK MEDICAL CARE IF:  · Your symptoms get worse.  · You have new symptoms, such as:    Headache.    Dizziness.    Visual  changes.  SEEK IMMEDIATE MEDICAL CARE IF:  · You develop a severe or sudden headache.  · You have seizures.  · You develop numbness or weakness on one side of your body.  · You have difficulty thinking, speaking, or swallowing.  · You develop severe abdominal pain.  · You develop difficulty breathing, chest pain, a racing heartbeat, or heart palpitations.  These symptoms may represent a serious problem that is an emergency. Do not wait to see if the symptoms will go away. Get medical help right away. Call your local emergency services (911 in the [place] Do not drive yourself to the hospital.     This information is not intended to replace advice given to you by your health care provider. Make sure you discuss any questions you have with your health care provider.     Document Released: 01/04/2014 Document Reviewed: 01/04/2014  Elsevier Interactive Patient Education ©2016 [employer]

## 2015-06-12 ENCOUNTER — Ambulatory Visit (HOSPITAL_COMMUNITY): Admission: RE | Admit: 2015-06-12 | Payer: 59 | Source: Ambulatory Visit

## 2015-07-10 ENCOUNTER — Encounter: Payer: Self-pay | Admitting: *Deleted

## 2015-07-10 ENCOUNTER — Ambulatory Visit (INDEPENDENT_AMBULATORY_CARE_PROVIDER_SITE_OTHER): Payer: 59 | Admitting: Internal Medicine

## 2015-07-10 VITALS — BP 122/66 | HR 88 | Ht 62.0 in | Wt 242.0 lb

## 2015-07-10 DIAGNOSIS — I340 Nonrheumatic mitral (valve) insufficiency: Secondary | ICD-10-CM

## 2015-07-10 MED ORDER — LABETALOL HCL 200 MG PO TABS: 200.0000 mg | ORAL_TABLET | Freq: Two times a day (BID) | ORAL | Status: DC

## 2015-07-10 NOTE — Progress Notes (Signed)
Cardiology Office Note   Date:  07/10/2015   ID:  Katelyn [name], DOB 04/03/1986, MRN [id number]  PCP:  Neena Rhymes, MD  Cardiologist:   Dietrich Pates, MD    Pt presents in consult for evaluation of mitral regurgitation  Referred by Dr Beverely Low  History of Present Illness: Katelyn [name] is a 30 y.o. female with a history of preeclampisa  In Jan  2017  Echo done LVEF was normal  MR was mild to moderate    Pt was admitted the week after delivery with these problems  Echo done at that time  Since d/c breathing is good  No CP  No dizziness    Outpatient Prescriptions Prior to Visit  Medication Sig Dispense Refill  . cyclobenzaprine (FLEXERIL) 10 MG tablet Take 1 tablet (10 mg total) by mouth 3 (three) times daily as needed for muscle spasms. 30 tablet 1  . labetalol (NORMODYNE) 200 MG tablet Take 4 tablets (800 mg total) by mouth 3 (three) times daily. 60 tablet 3  . Prenatal Vit-Fe Fumarate-FA (PRENATAL MULTIVITAMIN) TABS tablet Take 1 tablet by mouth at bedtime.     . hydrochlorothiazide (HYDRODIURIL) 12.5 MG tablet Take 2 tablets (25 mg total) by mouth daily. 20 tablet 0   No facility-administered medications prior to visit.     Allergies:   Review of patient's allergies indicates no known allergies.   Past Medical History  Diagnosis Date  . Anemia     prior to pregnancy   . Migraines     Past Surgical History  Procedure Laterality Date  . Cesarean section N/A 05/30/2015    Procedure: CESAREAN SECTION;  Surgeon: Candice Camp, MD;  Location: [facility];  Service: Obstetrics;  Laterality: N/A;     Social History:  The patient  reports that she has never smoked. She has never used smokeless tobacco. She reports that she drinks alcohol. She reports that she does not use illicit drugs.   Family History:  The patient's family history includes Asthma in her sister; Breast cancer in her maternal grandmother and paternal grandmother; Diabetes in her maternal aunt and  maternal uncle; Diabetes type I in her maternal aunt, maternal grandmother, and paternal grandmother; Heart disease (age of onset: 83) in her father; Heart failure (age of onset: 42) in her father; Hemophilia in her cousin; Hypertension in her mother; Kidney disease in her maternal grandmother, maternal uncle, and paternal grandmother; Rheum arthritis in her maternal aunt and maternal grandmother; Thyroid disease in her paternal aunt.    ROS:  Please see the history of present illness. All other systems are reviewed and  Negative to the above problem except as noted.    PHYSICAL EXAM: VS:  BP 122/66 mmHg  Pulse 88  Ht  (1.575 m)  Wt 109.77 kg (242 lb)  BMI 44.25 kg/m2  WUJ:WJXBJYNW obese 28, in no acute distress HEENT: normal Neck: no JVD, carotid bruits, or masses Cardiac: RRR; no murmurs, rubs, or gallops,no edema  Respiratory:  clear to auscultation bilaterally, normal work of breathing GI: soft, nontender, nondistended, + BS  No hepatomegaly  MS: no deformity Moving all extremities   Skin: warm and dry, no rash Neuro:  Strength and sensation are intact Psych: euthymic mood, full affect   EKG:  EKG is not ordered today. On 1/21 SR 78 bpm     Lipid Panel    Component Value Date/Time   CHOL 125 11/30/2012 1113   TRIG 98.0 11/30/2012 1113  HDL 38.20* 11/30/2012 1113   CHOLHDL 3 11/30/2012 1113   VLDL 19.6 11/30/2012 1113   LDLCALC 67 11/30/2012 1113      Wt Readings from Last 3 Encounters:  07/10/15 109.77 kg (242 lb)  06/08/15 119.387 kg (263 lb 3.2 oz)  05/29/15 119.296 kg (263 lb)      ASSESSMENT AND PLAN:   1  Mitral regurgitation  I have reviewed echo images with pt  MR is prob mild in severity  This was done when admitted with preeclampsia  May have improved now  I would recomm f/u echo in October to reeval severity  Further f/u baed on that   2  Hx preeclampsia  BP is improved  On 200 bid labetalol  OK to continue to taper    Current medicines are  reviewed at length with the patient today.  The patient does not have concerns regarding medicines.  The following changes have been made:   F/U will be based on test results   Signed, Dietrich Pates, MD  07/10/2015 3:49 PM    [facility] [address] [place], [place], [place]  [address] Phone: [phone]; Fax: [phone]

## 2015-07-10 NOTE — Patient Instructions (Signed)

## 2016-02-16 ENCOUNTER — Other Ambulatory Visit (HOSPITAL_COMMUNITY): Payer: 59

## 2016-06-09 ENCOUNTER — Telehealth (HOSPITAL_COMMUNITY): Payer: Self-pay | Admitting: Radiology

## 2016-06-09 NOTE — Telephone Encounter (Signed)
need to reschedule echocardiogram

## 2016-07-23 ENCOUNTER — Emergency Department (HOSPITAL_COMMUNITY): Payer: No Typology Code available for payment source

## 2016-07-23 ENCOUNTER — Emergency Department (HOSPITAL_COMMUNITY)
Admission: EM | Admit: 2016-07-23 | Discharge: 2016-07-23 | Disposition: A | Discharge status: Home / Self Care | Payer: No Typology Code available for payment source | Attending: Emergency Medicine | Admitting: Emergency Medicine

## 2016-07-23 ENCOUNTER — Encounter (HOSPITAL_COMMUNITY): Payer: Self-pay | Admitting: Emergency Medicine

## 2016-07-23 DIAGNOSIS — Y999 Unspecified external cause status: Secondary | ICD-10-CM | POA: Insufficient documentation

## 2016-07-23 DIAGNOSIS — R109 Unspecified abdominal pain: Secondary | ICD-10-CM | POA: Diagnosis not present

## 2016-07-23 DIAGNOSIS — M546 Pain in thoracic spine: Secondary | ICD-10-CM | POA: Diagnosis not present

## 2016-07-23 DIAGNOSIS — Z79899 Other long term (current) drug therapy: Secondary | ICD-10-CM | POA: Diagnosis not present

## 2016-07-23 DIAGNOSIS — M7918 Myalgia, other site: Secondary | ICD-10-CM

## 2016-07-23 DIAGNOSIS — M79605 Pain in left leg: Secondary | ICD-10-CM | POA: Diagnosis present

## 2016-07-23 DIAGNOSIS — M25551 Pain in right hip: Secondary | ICD-10-CM | POA: Diagnosis not present

## 2016-07-23 DIAGNOSIS — Y939 Activity, unspecified: Secondary | ICD-10-CM | POA: Diagnosis not present

## 2016-07-23 DIAGNOSIS — Y92410 Unspecified street and highway as the place of occurrence of the external cause: Secondary | ICD-10-CM | POA: Insufficient documentation

## 2016-07-23 DIAGNOSIS — M545 Low back pain: Secondary | ICD-10-CM | POA: Insufficient documentation

## 2016-07-23 HISTORY — DX: Anxiety disorder, unspecified: F41.9

## 2016-07-23 HISTORY — DX: Dorsalgia, unspecified: M54.9

## 2016-07-23 HISTORY — DX: Abrasion of unspecified back wall of thorax, initial encounter: S20.419A

## 2016-07-23 LAB — POC URINE PREG, ED: Preg Test, Ur: NEGATIVE

## 2016-07-23 MED ORDER — HYDROCODONE-ACETAMINOPHEN 5-325 MG PO TABS
1.0000 | ORAL_TABLET | Freq: Once | ORAL | Status: AC
  Administered 2016-07-23: 1 via ORAL
  Filled 2016-07-23: qty 1

## 2016-07-23 MED ORDER — METHOCARBAMOL 500 MG PO TABS: 1000.0000 mg | ORAL_TABLET | Freq: Three times a day (TID) | ORAL | 0 refills | Status: DC | PRN

## 2016-07-23 MED ORDER — IBUPROFEN 600 MG PO TABS: 600.0000 mg | ORAL_TABLET | Freq: Four times a day (QID) | ORAL | 0 refills | Status: DC | PRN
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
## 2016-07-23 NOTE — ED Triage Notes (Signed)
Pt c/o acute onset of shortness of breath and chest pain

## 2017-12-06 ENCOUNTER — Other Ambulatory Visit: Payer: Self-pay

## 2017-12-06 ENCOUNTER — Encounter (HOSPITAL_COMMUNITY): Payer: Self-pay | Admitting: *Deleted

## 2017-12-06 ENCOUNTER — Emergency Department (HOSPITAL_COMMUNITY)
Admission: EM | Admit: 2017-12-06 | Discharge: 2017-12-07 | Disposition: A | Discharge status: Home / Self Care | Payer: 59 | Attending: Emergency Medicine | Admitting: Emergency Medicine

## 2017-12-06 ENCOUNTER — Emergency Department (HOSPITAL_COMMUNITY): Payer: 59

## 2017-12-06 DIAGNOSIS — R112 Nausea with vomiting, unspecified: Secondary | ICD-10-CM | POA: Diagnosis present

## 2017-12-06 DIAGNOSIS — F419 Anxiety disorder, unspecified: Secondary | ICD-10-CM | POA: Insufficient documentation

## 2017-12-06 DIAGNOSIS — R1013 Epigastric pain: Secondary | ICD-10-CM | POA: Insufficient documentation

## 2017-12-06 DIAGNOSIS — R079 Chest pain, unspecified: Secondary | ICD-10-CM

## 2017-12-06 DIAGNOSIS — R197 Diarrhea, unspecified: Secondary | ICD-10-CM

## 2017-12-06 DIAGNOSIS — Z79899 Other long term (current) drug therapy: Secondary | ICD-10-CM | POA: Diagnosis not present

## 2017-12-06 HISTORY — DX: Gastro-esophageal reflux disease without esophagitis: K21.9

## 2017-12-06 LAB — CBC
HCT: 38.9 % (ref 36.0–46.0)
Hemoglobin: 12.7 g/dL (ref 12.0–15.0)
MCH: 26.8 pg (ref 26.0–34.0)
MCHC: 32.6 g/dL (ref 30.0–36.0)
MCV: 82.1 fL (ref 78.0–100.0)
PLATELETS: 205 10*3/uL (ref 150–400)
RBC: 4.74 MIL/uL (ref 3.87–5.11)
RDW: 13.7 % (ref 11.5–15.5)
WBC: 6.3 10*3/uL (ref 4.0–10.5)

## 2017-12-06 LAB — COMPREHENSIVE METABOLIC PANEL
ALBUMIN: 4.3 g/dL (ref 3.5–5.0)
ALT: 23 U/L (ref 0–44)
AST: 18 U/L (ref 15–41)
Alkaline Phosphatase: 69 U/L (ref 38–126)
Anion gap: 11 (ref 5–15)
BUN: 7 mg/dL (ref 6–20)
CHLORIDE: 104 mmol/L (ref 98–111)
CO2: 23 mmol/L (ref 22–32)
Calcium: 9.3 mg/dL (ref 8.9–10.3)
Creatinine, Ser: 0.88 mg/dL (ref 0.44–1.00)
GFR calc Af Amer: >60 mL/min (ref >60)
GFR calc non Af Amer: >60 mL/min (ref >60)
GLUCOSE: 112 mg/dL — AB (ref 70–99)
POTASSIUM: 3.5 mmol/L (ref 3.5–5.1)
Sodium: 138 mmol/L (ref 135–145)
Total Bilirubin: 0.6 mg/dL (ref 0.3–1.2)
Total Protein: 7.7 g/dL (ref 6.5–8.1)

## 2017-12-06 LAB — URINALYSIS, ROUTINE W REFLEX MICROSCOPIC
Glucose, UA: NEGATIVE mg/dL
Hgb urine dipstick: NEGATIVE
KETONES UR: 20 mg/dL — AB
Leukocytes, UA: NEGATIVE
Nitrite: NEGATIVE
PH: 6 (ref 5.0–8.0)
Protein, ur: 100 mg/dL — AB
Specific Gravity, Urine: 1.031 — ABNORMAL HIGH (ref 1.005–1.030)

## 2017-12-06 LAB — I-STAT TROPONIN, ED: Troponin i, poc: 0 ng/mL (ref 0.00–0.08)

## 2017-12-06 LAB — LIPASE, BLOOD: LIPASE: 26 U/L (ref 11–51)

## 2017-12-06 LAB — I-STAT BETA HCG BLOOD, ED (MC, WL, AP ONLY): I-stat hCG, quantitative: <5 m[IU]/mL (ref <5)

## 2017-12-06 MED ORDER — FAMOTIDINE IN NACL 20-0.9 MG/50ML-% IV SOLN
20.0000 mg | Freq: Once | INTRAVENOUS | Status: AC
  Administered 2017-12-06: 20 mg via INTRAVENOUS
  Filled 2017-12-06: qty 50

## 2017-12-06 MED ORDER — SODIUM CHLORIDE 0.9 % IV BOLUS
1000.0000 mL | Freq: Once | INTRAVENOUS | Status: AC
  Administered 2017-12-06: 1000 mL via INTRAVENOUS

## 2017-12-06 MED ORDER — HALOPERIDOL LACTATE 5 MG/ML IJ SOLN
2.0000 mg | Freq: Once | INTRAMUSCULAR | Status: AC
  Administered 2017-12-06: 2 mg via INTRAVENOUS
  Filled 2017-12-06: qty 1

## 2017-12-06 MED ORDER — METOCLOPRAMIDE HCL 5 MG/ML IJ SOLN
10.0000 mg | Freq: Once | INTRAMUSCULAR | Status: AC
  Administered 2017-12-06: 10 mg via INTRAVENOUS
  Filled 2017-12-06: qty 2

## 2017-12-06 MED ORDER — DICYCLOMINE HCL 10 MG/ML IM SOLN
20.0000 mg | Freq: Once | INTRAMUSCULAR | Status: AC
Start: 2017-12-06 — End: 2017-12-06
  Administered 2017-12-06: 20 mg via INTRAMUSCULAR
  Filled 2017-12-06: qty 2

## 2017-12-06 MED ORDER — GI COCKTAIL ~~LOC~~
30.0000 mL | Freq: Once | ORAL | Status: AC
  Administered 2017-12-07: 30 mL via ORAL
  Filled 2017-12-06: qty 30

## 2017-12-06 NOTE — ED Triage Notes (Signed)
Pt says that she has had vomiting and diarrhea for about a week. Onset of CP today, generalized chest area, started while she was vomiting. Tried to take dramamine for vomiting around 1800 but vomited.

## 2017-12-06 NOTE — ED Triage Notes (Signed)
Pt actively vomiting clear yellow emesis in triage, still does not want zofran. Comfort measure and cool towel.

## 2017-12-06 NOTE — ED Triage Notes (Signed)
Offered zofran in triage as the patient is actively vomiting, states she does not want it as it makes her symptoms worse.

## 2017-12-07 ENCOUNTER — Emergency Department (HOSPITAL_COMMUNITY): Payer: 59

## 2017-12-07 ENCOUNTER — Encounter (HOSPITAL_COMMUNITY): Payer: Self-pay | Admitting: *Deleted

## 2017-12-07 LAB — I-STAT TROPONIN, ED: TROPONIN I, POC: 0 ng/mL (ref 0.00–0.08)

## 2017-12-07 MED ORDER — IOPAMIDOL (ISOVUE-300) INJECTION 61%
INTRAVENOUS | Status: AC
  Filled 2017-12-07: qty 100

## 2017-12-07 MED ORDER — PROMETHAZINE HCL 25 MG RE SUPP: 25.0000 mg | Freq: Four times a day (QID) | RECTAL | 0 refills | Status: DC | PRN

## 2017-12-07 MED ORDER — IOPAMIDOL (ISOVUE-300) INJECTION 61%
100.0000 mL | Freq: Once | INTRAVENOUS | Status: AC | PRN
  Administered 2017-12-07: 100 mL via INTRAVENOUS

## 2017-12-07 NOTE — ED Notes (Signed)
Patient transported to CT 

## 2017-12-07 NOTE — ED Provider Notes (Signed)
[facility] Provider Note   CSN: [id number] Arrival date & time: 12/06/17  2046     History   Chief Complaint Chief Complaint  Patient presents with  . Chest Pain    HPI Katelyn [name] is a 32 y.o. female.  HPI 32 year old African-American female past medical history significant for GERD presents to the emergency department today for evaluation of nausea, vomiting, diarrhea with generalized abdominal pain.  Reports severe vomiting with some intermittent hematemesis.  Patient reports onset of chest pain today.  The chest pain is generalized and started while she was vomiting.  Denies any associated shortness of breath or diaphoresis.  Patient reports history of severe vomiting is followed by GI for this.  She is tried her Dramamine and Phenergan at home with little relief of her symptoms.  Denies any cardiac history.  Denies any PE risk factors.  Patient states that the GI doctor has not been able to figure out what is causing her symptoms.  She denies any drug use.  Denies any urinary symptoms.  Denies any bloody stools.  Patient denies any associated fevers or chills.  Nothing makes her symptoms better or worse.  Pt denies any fever, chill, ha, vision changes, lightheadedness, dizziness, congestion, neck pain, cp, sob, cough,  urinary symptoms, melena, hematochezia, lower extremity paresthesias.  Past Medical History:  Diagnosis Date  . Anemia    prior to pregnancy   . Anxiety   . Back abrasion   . Back pain   . GERD (gastroesophageal reflux disease)   . Migraines     Patient Active Problem List   Diagnosis Date Noted  . Pre-eclampsia, postpartum 06/08/2015  . Preeclampsia 06/07/2015  . Pregnancy 05/29/2015  . Sinusitis 05/18/2012  . 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 Hot flashes 02/05/2011  . Family history of breast cancer 11/10/2010  . General medical examination 11/10/2010  . Other screening mammogram 11/10/2010  . Menstrual cycle  problem 10/19/2010  . Chest pain 08/25/2010  . Back pain 08/14/2010  . Daily headache 08/14/2010    Past Surgical History:  Procedure Laterality Date  . CESAREAN SECTION N/A 05/30/2015   Procedure: CESAREAN SECTION;  Surgeon: Candice Camp, MD;  Location: [facility];  Service: Obstetrics;  Laterality: N/A;     OB History    Gravida  1   Para  1   Term  1   Preterm      AB      Living  1     SAB      TAB      Ectopic      Multiple  0   Live Births  1            Home Medications    Prior to Admission medications   Medication Sig Start Date End Date Taking? Authorizing Provider  cyclobenzaprine (FLEXERIL) 10 MG tablet Take 1 tablet (10 mg total) by mouth 3 (three) times daily as needed for muscle spasms. 06/09/15   Richarda Overlie, MD  ibuprofen (ADVIL,MOTRIN) 600 MG tablet Take 1 tablet (600 mg total) by mouth every 6 (six) hours as needed. 07/23/16   Burgess Amor, PA-C  labetalol (NORMODYNE) 200 MG tablet Take 1 tablet (200 mg total) by mouth 2 (two) times daily. 07/10/15   Pricilla Riffle, MD  methocarbamol (ROBAXIN) 500 MG tablet Take 2 tablets (1,000 mg total) by mouth every 8 (eight) hours as needed for muscle spasms. 07/23/16   Burgess Amor, PA-C  Prenatal Vit-Fe  Fumarate-FA (PRENATAL MULTIVITAMIN) TABS tablet Take 1 tablet by mouth at bedtime.     [provider]  promethazine (PHENERGAN) 25 MG suppository Place 1 suppository (25 mg total) rectally every 6 (six) hours as needed for nausea or vomiting. 12/07/17   Joeanne Robicheaux, Lynann Beaver, PA-C    Family History Family History  Problem Relation Age of Onset  . Heart failure Father 36  . Heart disease Father 92  . Breast cancer Paternal Grandmother   . Kidney disease Paternal Grandmother   . Diabetes type I Paternal Grandmother   . Breast cancer Maternal Grandmother   . Kidney disease Maternal Grandmother   . Diabetes type I Maternal Grandmother   . Rheum arthritis Maternal Grandmother   . Kidney disease Maternal  Uncle   . Diabetes type I Maternal Aunt   . Diabetes Maternal Aunt   . Diabetes Maternal Uncle   . Asthma Sister   . Hypertension Mother   . Thyroid disease Paternal Aunt   . Rheum arthritis Maternal Aunt   . Hemophilia Cousin        2 cousins    Social History Social History   Tobacco Use  . Smoking status: Never Smoker  . Smokeless tobacco: Never Used  . Tobacco comment: casual smoker in college  Substance Use Topics  . Alcohol use: Yes    Comment: occasionally  . Drug use: No     Allergies   Patient has no known allergies.   Review of Systems Review of Systems  All other systems reviewed and are negative.    Physical Exam Updated Vital Signs BP (!) 98/51 (BP Location: Right Arm)   Pulse 76   Temp 98.2 F (36.8 C) (Oral)   Resp 14   SpO2 99%   Physical Exam  Constitutional: She is oriented to person, place, and time. She appears well-developed and well-nourished.  Non-toxic appearance. She appears distressed.  Patient actively dry heaving in the room.  HENT:  Head: Normocephalic and atraumatic.  Nose: Nose normal.  Mouth/Throat: Oropharynx is clear and moist.  Eyes: Pupils are equal, round, and reactive to light. Conjunctivae are normal. Right eye exhibits no discharge. Left eye exhibits no discharge.  Neck: Normal range of motion. Neck supple. No JVD present. No tracheal deviation present.  Cardiovascular: Normal rate, regular rhythm, normal heart sounds and intact distal pulses. Exam reveals no gallop and no friction rub.  No murmur heard. Pulmonary/Chest: Effort normal and breath sounds normal. No stridor. No respiratory distress. She has no decreased breath sounds. She has no wheezes. She has no rhonchi. She has no rales. She exhibits no tenderness.  No hypoxia or tachypnea.  Abdominal: Soft. Bowel sounds are normal. She exhibits no distension. There is generalized tenderness. There is no rigidity, no rebound, no guarding and no CVA tenderness.    Musculoskeletal: Normal range of motion.  No lower extremity edema or calf tenderness.  Lymphadenopathy:    She has no cervical adenopathy.  Neurological: She is alert and oriented to person, place, and time.  Skin: Skin is warm and dry. Capillary refill takes less than 2 seconds. She is not diaphoretic.  Psychiatric: Her behavior is normal. Judgment and thought content normal.  Nursing note and vitals reviewed.    ED Treatments / Results  Labs (all labs ordered are listed, but only abnormal results are displayed) Labs Reviewed  COMPREHENSIVE METABOLIC PANEL - Abnormal; Notable for the following components:      Result Value   Glucose, Bld  112 (*)    All other components within normal limits  URINALYSIS, ROUTINE W REFLEX MICROSCOPIC - Abnormal; Notable for the following components:   Color, Urine AMBER (*)    APPearance CLOUDY (*)    Specific Gravity, Urine 1.031 (*)    Bilirubin Urine SMALL (*)    Ketones, ur 20 (*)    Protein, ur 100 (*)    Bacteria, UA MANY (*)    All other components within normal limits  URINE CULTURE  CBC  LIPASE, BLOOD  I-STAT TROPONIN, ED  I-STAT BETA HCG BLOOD, ED ([facility], [facility], [facility] ONLY)  I-STAT TROPONIN, ED    EKG None  Radiology Dg Chest 2 View  Result Date: 12/06/2017 CLINICAL DATA:  Shortness of breath. EXAM: CHEST - 2 VIEW COMPARISON:  Radiographs of July 23, 2016. FINDINGS: The heart size and mediastinal contours are within normal limits. Both lungs are clear. No pneumothorax or pleural effusion is noted. The visualized skeletal structures are unremarkable. IMPRESSION: No active cardiopulmonary disease. Electronically Signed   By: Lupita Raider, M.D.   On: 12/06/2017 21:41   Ct Chest W Contrast  Result Date: 12/07/2017 CLINICAL DATA:  Vomiting and diarrhea chest pain EXAM: CT CHEST, ABDOMEN, AND PELVIS WITH CONTRAST TECHNIQUE: Multidetector CT imaging of the chest, abdomen and pelvis was performed following the standard protocol during bolus  administration of intravenous contrast. CONTRAST:  ISOVUE-300 IOPAMIDOL (ISOVUE-300) INJECTION 61% COMPARISON:  Chest x-ray 12/06/2017 FINDINGS: CT CHEST FINDINGS Cardiovascular: Nonaneurysmal aorta. Normal heart size. No pericardial effusion Mediastinum/Nodes: No enlarged mediastinal, hilar, or axillary lymph nodes. Thyroid gland, trachea, and esophagus demonstrate no significant findings. Lungs/Pleura: Lungs are clear. No pleural effusion or pneumothorax. Musculoskeletal: No chest wall mass or suspicious bone lesions identified. CT ABDOMEN PELVIS FINDINGS Hepatobiliary: No focal liver abnormality is seen. No gallstones, gallbladder wall thickening, or biliary dilatation. Pancreas: Unremarkable. No pancreatic ductal dilatation or surrounding inflammatory changes. Spleen: Normal in size without focal abnormality. Adrenals/Urinary Tract: Adrenal glands are unremarkable. Kidneys are normal, without renal calculi, focal lesion, or hydronephrosis. Bladder is unremarkable. Stomach/Bowel: Stomach is within normal limits. Appendix appears normal. No evidence of bowel wall thickening, distention, or inflammatory changes. Vascular/Lymphatic: No significant vascular findings are present. No enlarged abdominal or pelvic lymph nodes. Reproductive: Uterus and bilateral adnexa are unremarkable. Other: No abdominal wall hernia or abnormality. No abdominopelvic ascites. Musculoskeletal: No acute or significant osseous findings. IMPRESSION: Negative. No CT evidence for acute intrathoracic, intra-abdominal or intrapelvic abnormality. Electronically Signed   By: Jasmine Pang M.D.   On: 12/07/2017 01:01   Ct Abdomen Pelvis W Contrast  Result Date: 12/07/2017 CLINICAL DATA:  Vomiting and diarrhea chest pain EXAM: CT CHEST, ABDOMEN, AND PELVIS WITH CONTRAST TECHNIQUE: Multidetector CT imaging of the chest, abdomen and pelvis was performed following the standard protocol during bolus administration of intravenous contrast.  CONTRAST:  ISOVUE-300 IOPAMIDOL (ISOVUE-300) INJECTION 61% COMPARISON:  Chest x-ray 12/06/2017 FINDINGS: CT CHEST FINDINGS Cardiovascular: Nonaneurysmal aorta. Normal heart size. No pericardial effusion Mediastinum/Nodes: No enlarged mediastinal, hilar, or axillary lymph nodes. Thyroid gland, trachea, and esophagus demonstrate no significant findings. Lungs/Pleura: Lungs are clear. No pleural effusion or pneumothorax. Musculoskeletal: No chest wall mass or suspicious bone lesions identified. CT ABDOMEN PELVIS FINDINGS Hepatobiliary: No focal liver abnormality is seen. No gallstones, gallbladder wall thickening, or biliary dilatation. Pancreas: Unremarkable. No pancreatic ductal dilatation or surrounding inflammatory changes. Spleen: Normal in size without focal abnormality. Adrenals/Urinary Tract: Adrenal glands are unremarkable. Kidneys are normal, without renal calculi, focal  lesion, or hydronephrosis. Bladder is unremarkable. Stomach/Bowel: Stomach is within normal limits. Appendix appears normal. No evidence of bowel wall thickening, distention, or inflammatory changes. Vascular/Lymphatic: No significant vascular findings are present. No enlarged abdominal or pelvic lymph nodes. Reproductive: Uterus and bilateral adnexa are unremarkable. Other: No abdominal wall hernia or abnormality. No abdominopelvic ascites. Musculoskeletal: No acute or significant osseous findings. IMPRESSION: Negative. No CT evidence for acute intrathoracic, intra-abdominal or intrapelvic abnormality. Electronically Signed   By: Jasmine Pang M.D.   On: 12/07/2017 01:01    Procedures Procedures (including critical care time)  Medications Ordered in ED Medications  metoCLOPramide (REGLAN) injection 10 mg (10 mg Intravenous Given 12/06/17 2311)  gi cocktail (Maalox,Lidocaine,Donnatal) (30 mLs Oral Given 12/07/17 0053)  famotidine (PEPCID) IVPB 20 mg premix (0 mg Intravenous Stopped 12/06/17 2353)  sodium chloride 0.9 % bolus  1,000 mL (0 mLs Intravenous Stopped 12/07/17 0017)  dicyclomine (BENTYL) injection 20 mg (20 mg Intramuscular Given 12/06/17 2313)  haloperidol lactate (HALDOL) injection 2 mg (2 mg Intravenous Given 12/06/17 2337)  iopamidol (ISOVUE-300) 61 % injection 100 mL (100 mLs Intravenous Contrast Given 12/07/17 0032)     Initial Impression / Assessment and Plan / ED Course  I have reviewed the triage vital signs and the nursing notes.  Pertinent labs & imaging results that were available during my care of the patient were reviewed by me and considered in my medical decision making (see chart for details).     Patient presents to the ED for evaluation of nausea, vomiting, diarrhea generalized abdominal pain and chest pain that started after vomiting today.  Patient is seen by GI for vomiting.  Recent started on Protonix.  Has not had a recent endoscopy.  Vital signs reassuring in the emergency department today.  Lab work reassuring.  No leukocytosis.  No significant electrolyte derangement.  Normal hemoglobin.  Normal liver enzymes and kidney function.  UA shows many bacteria with many squamous epithelial cells and is nitrite negative.  Patient has no urinary symptoms.  Lipase normal.  Pregnancy test was negative.  Troponin was negative.  EKG shows normal sinus rhythm.  Chest x-ray was reassuring.  Patient was given fluids and Reglan in the ED.  She continued to have severe vomiting.  Given patient's chest pain with severe vomiting CT scan of the chest and abd were performed to rule any perforation.  These returned and showed no acute findings.  Patient was given Haldol.  Symptoms significantly improved.  Tolerated p.o. fluids without any emesis.  Patient's pain improved with GI cocktail and Pepcid.  Patient felt stable for discharge.  We will follow-up with her gastroenterologist.,  Presentation not consistent with dissection, PE or ACS.  No signs of intra-abdominal infection.  Pt is hemodynamically stable, in  NAD, & able to ambulate in the ED. Evaluation does not show pathology that would require ongoing emergent intervention or inpatient treatment. I explained the diagnosis to the patient. Pain has been managed & has no complaints prior to dc. Pt is comfortable with above plan and is stable for discharge at this time. All questions were answered prior to disposition. Strict return precautions for f/u to the ED were discussed. Encouraged follow up with PCP.   Final Clinical Impressions(s) / ED Diagnoses   Final diagnoses:  Nonspecific chest pain  Epigastric pain  Nausea vomiting and diarrhea    ED Discharge Orders        Ordered    promethazine (PHENERGAN) 25 MG suppository  Every 6  hours PRN     12/07/17 0246       Rise MuLeaphart, Corryn Madewell T, PA-C 12/07/17 [id number]    Geoffery Lyonselo, [place], MD 12/15/17 2303

## 2017-12-07 NOTE — ED Notes (Addendum)
Pt tolerating fluids with no difficulty  

## 2017-12-07 NOTE — Discharge Instructions (Addendum)
Please follow-up with your GI doctor.  Return the ED with any worsening symptoms.  Continue medications at home.

## 2017-12-08 LAB — URINE CULTURE

## 2019-01-25 ENCOUNTER — Other Ambulatory Visit: Payer: Self-pay

## 2019-01-25 DIAGNOSIS — Z20822 Contact with and (suspected) exposure to covid-19: Secondary | ICD-10-CM

## 2019-01-26 LAB — NOVEL CORONAVIRUS, NAA: SARS-CoV-2, NAA: NOT DETECTED

## 2019-04-16 ENCOUNTER — Other Ambulatory Visit: Payer: Self-pay

## 2019-04-16 ENCOUNTER — Emergency Department (HOSPITAL_COMMUNITY)
Admission: EM | Admit: 2019-04-16 | Discharge: 2019-04-16 | Disposition: A | Discharge status: Home / Self Care | Payer: 59 | Attending: Emergency Medicine | Admitting: Emergency Medicine

## 2019-04-16 ENCOUNTER — Encounter (HOSPITAL_COMMUNITY): Payer: Self-pay | Admitting: Emergency Medicine

## 2019-04-16 DIAGNOSIS — Z5321 Procedure and treatment not carried out due to patient leaving prior to being seen by health care provider: Secondary | ICD-10-CM | POA: Diagnosis not present

## 2019-04-16 DIAGNOSIS — R109 Unspecified abdominal pain: Secondary | ICD-10-CM | POA: Diagnosis present

## 2019-04-16 NOTE — ED Notes (Signed)
No response when called from lobby to revitalize.

## 2019-04-16 NOTE — ED Triage Notes (Signed)
Pt c/o abd pains, n/v for several weeks but got worse during the night and today. Had endoscopy done and got results this morning and told had bacteria in intestines but unsure if cause. Reports not currently on antibiotics. Also having menstrual cycle for 2 straight weeks and wants to be seen for that.

## 2019-04-17 DIAGNOSIS — Z8619 Personal history of other infectious and parasitic diseases: Secondary | ICD-10-CM

## 2019-04-17 HISTORY — DX: Personal history of other infectious and parasitic diseases: Z86.19

## 2019-05-14 HISTORY — PX: CHOLECYSTECTOMY, LAPAROSCOPIC: SHX56

## 2019-11-01 ENCOUNTER — Other Ambulatory Visit: Payer: Self-pay | Admitting: Obstetrics and Gynecology

## 2020-12-12 ENCOUNTER — Encounter (HOSPITAL_COMMUNITY): Payer: Self-pay | Admitting: Obstetrics and Gynecology

## 2020-12-12 ENCOUNTER — Inpatient Hospital Stay (HOSPITAL_COMMUNITY)
Admission: AD | Admit: 2020-12-12 | Discharge: 2020-12-12 | Disposition: A | Discharge status: Home / Self Care | Payer: 59 | Attending: Obstetrics and Gynecology | Admitting: Obstetrics and Gynecology

## 2020-12-12 ENCOUNTER — Other Ambulatory Visit: Payer: Self-pay

## 2020-12-12 ENCOUNTER — Inpatient Hospital Stay (HOSPITAL_COMMUNITY): Payer: 59

## 2020-12-12 DIAGNOSIS — O3680X0 Pregnancy with inconclusive fetal viability, not applicable or unspecified: Secondary | ICD-10-CM | POA: Diagnosis not present

## 2020-12-12 DIAGNOSIS — Z3A01 Less than 8 weeks gestation of pregnancy: Secondary | ICD-10-CM

## 2020-12-12 DIAGNOSIS — O209 Hemorrhage in early pregnancy, unspecified: Secondary | ICD-10-CM

## 2020-12-12 LAB — CBC
HCT: 36.6 % (ref 36.0–46.0)
Hemoglobin: 11.7 g/dL — ABNORMAL LOW (ref 12.0–15.0)
MCH: 26.5 pg (ref 26.0–34.0)
MCHC: 32 g/dL (ref 30.0–36.0)
MCV: 82.8 fL (ref 80.0–100.0)
Platelets: 205 10*3/uL (ref 150–400)
RBC: 4.42 MIL/uL (ref 3.87–5.11)
RDW: 13.6 % (ref 11.5–15.5)
WBC: 4.4 10*3/uL (ref 4.0–10.5)
nRBC: 0 % (ref 0.0–0.2)

## 2020-12-12 LAB — URINALYSIS, ROUTINE W REFLEX MICROSCOPIC
Bilirubin Urine: NEGATIVE
Glucose, UA: NEGATIVE mg/dL
Ketones, ur: NEGATIVE mg/dL
Leukocytes,Ua: NEGATIVE
Nitrite: NEGATIVE
Protein, ur: NEGATIVE mg/dL
Specific Gravity, Urine: 1.002 — ABNORMAL LOW (ref 1.005–1.030)
pH: 6 (ref 5.0–8.0)

## 2020-12-12 LAB — COMPREHENSIVE METABOLIC PANEL
ALT: 23 U/L (ref 0–44)
AST: 20 U/L (ref 15–41)
Albumin: 3.5 g/dL (ref 3.5–5.0)
Alkaline Phosphatase: 61 U/L (ref 38–126)
Anion gap: 5 (ref 5–15)
BUN: 7 mg/dL (ref 6–20)
CO2: 25 mmol/L (ref 22–32)
Calcium: 9 mg/dL (ref 8.9–10.3)
Chloride: 106 mmol/L (ref 98–111)
Creatinine, Ser: 0.85 mg/dL (ref 0.44–1.00)
GFR, Estimated: >60 mL/min (ref >60)
Glucose, Bld: 99 mg/dL (ref 70–99)
Potassium: 4.1 mmol/L (ref 3.5–5.1)
Sodium: 136 mmol/L (ref 135–145)
Total Bilirubin: 0.3 mg/dL (ref 0.3–1.2)
Total Protein: 6.4 g/dL — ABNORMAL LOW (ref 6.5–8.1)

## 2020-12-12 LAB — WET PREP, GENITAL
Clue Cells Wet Prep HPF POC: NONE SEEN
Sperm: NONE SEEN
Trich, Wet Prep: NONE SEEN
Yeast Wet Prep HPF POC: NONE SEEN

## 2020-12-12 LAB — HCG, QUANTITATIVE, PREGNANCY: hCG, Beta Chain, Quant, S: 383 m[IU]/mL — ABNORMAL HIGH (ref <5)

## 2020-12-12 LAB — HIV ANTIBODY (ROUTINE TESTING W REFLEX): HIV Screen 4th Generation wRfx: NONREACTIVE

## 2020-12-12 LAB — POCT PREGNANCY, URINE: Preg Test, Ur: POSITIVE — AB

## 2020-12-12 NOTE — MAU Note (Signed)
Pt reports awakened this am with bleeding and pain. Positive home preg test two weeks ago. Bleeding is like a period and pain is in lower abd.

## 2020-12-12 NOTE — Discharge Instructions (Signed)
Please return to MAU immediately for shoulder pain, increased abdominal pain that is not relieved with Tylenol or any increased vaginal bleeding.

## 2020-12-12 NOTE — MAU Provider Note (Signed)
History     CSN: [id number]  Arrival date and time: 12/12/20 [id number]   Event Date/Time   First Provider Initiated Contact with Patient 12/12/20 1019      Chief Complaint  Patient presents with   Vaginal Bleeding   Possible Pregnancy   Abdominal Pain   Ms. Ponciano OrtJennifer H Hernan is a 35 y.o. year old 502P1001 female at 6767w3d weeks gestation who presents to MAU reporting she awakened this morning with vaginal bleeding and lower abdominal pain.  She reports a positive home pregnancy test 2 weeks ago, but has not had a confirmed in an office.  She reports vaginal bleeding was like a period and the pain in her lower abdomen was also like menstrual cramping.  Her pregnancy history is significant for C-section 5 years ago due to fetal distress. Her mom is present and contributing to the history taking.    OB History     Gravida  2   Para  1   Term  1   Preterm      AB      Living  1      SAB      IAB      Ectopic      Multiple  0   Live Births  1           Past Medical History:  Diagnosis Date   Anemia    prior to pregnancy    Anxiety    Back abrasion    Back pain    GERD (gastroesophageal reflux disease)    Migraines     Past Surgical History:  Procedure Laterality Date   CESAREAN SECTION N/A 05/30/2015   Procedure: CESAREAN SECTION;  Surgeon: Candice Campavid Lowe, MD;  Location: [facility];  Service: Obstetrics;  Laterality: N/A;    Family History  Problem Relation Age of Onset   Heart failure Father 1432   Heart disease Father 3532   Breast cancer Paternal Grandmother    Kidney disease Paternal Grandmother    Diabetes type I Paternal Grandmother    Breast cancer Maternal Grandmother    Kidney disease Maternal Grandmother    Diabetes type I Maternal Grandmother    Rheum arthritis Maternal Grandmother    Kidney disease Maternal Uncle    Diabetes type I Maternal Aunt    Diabetes Maternal Aunt    Diabetes Maternal Uncle    Asthma Sister    Hypertension Mother     Thyroid disease Paternal Aunt    Rheum arthritis Maternal Aunt    Hemophilia Cousin        2 cousins    Social History   Tobacco Use   Smoking status: Never   Smokeless tobacco: Never   Tobacco comments:    casual smoker in college  Substance Use Topics   Alcohol use: Yes    Comment: occasionally   Drug use: No    Allergies: No Known Allergies  No medications prior to admission.    Review of Systems  Constitutional: Negative.   HENT: Negative.    Eyes: Negative.   Respiratory: Negative.    Cardiovascular: Negative.   Gastrointestinal: Negative.   Endocrine: Negative.   Genitourinary:  Positive for pelvic pain ("all across lower abd, but R>L") and vaginal bleeding ("like a period this AM, but now brownish d/c").  Musculoskeletal: Negative.   Skin: Negative.   Allergic/Immunologic: Negative.   Neurological: Negative.   Hematological: Negative.   Psychiatric/Behavioral: Negative.    Physical Exam  Blood pressure 135/81, pulse 91, temperature 98.8 F (37.1 C), resp. rate 18, height 5\' 2"  (1.575 m), weight 108.9 kg, last menstrual period 10/21/2020, SpO2 96 %.  Physical Exam Vitals and nursing note reviewed. Exam conducted with a chaperone present.  Constitutional:      Appearance: Normal appearance. She is obese.  Cardiovascular:     Pulses: Normal pulses.  Pulmonary:     Effort: Pulmonary effort is normal.  Abdominal:     Palpations: Abdomen is soft.  Genitourinary:    General: Normal vulva.     Comments: Pelvic exam: External genitalia normal, SE: vaginal walls pink and well rugated, cervix is smooth, pink, no lesions, small amt of dark, brown vaginal d/c -- WP, GC/CT done, cervix visually closed, Uterus is mildly tender, no CMT or friability, moderate RT adnexal tenderness.  Musculoskeletal:        General: Normal range of motion.  Skin:    General: Skin is warm and dry.  Neurological:     Mental Status: She is alert and oriented to person, place, and  time.  Psychiatric:        Mood and Affect: Mood normal.        Behavior: Behavior normal.        Thought Content: Thought content normal.        Judgment: Judgment normal.    MAU Course  Procedures  MDM [facility] UPT CBC ABO/Rh -- not drawn HCG Wet Prep GC/CT -- pending HIV -- pending OB < 14 wks 12/21/2020 with TV  Results for orders placed or performed during the hospital encounter of 12/12/20 (from the past 24 hour(s))  Pregnancy, urine POC     Status: Abnormal   Collection Time: 12/12/20  8:49 AM  Result Value Ref Range   Preg Test, Ur POSITIVE (A) NEGATIVE  Wet prep, genital     Status: Abnormal   Collection Time: 12/12/20  8:57 AM  Result Value Ref Range   Yeast Wet Prep HPF POC NONE SEEN NONE SEEN   Trich, Wet Prep NONE SEEN NONE SEEN   Clue Cells Wet Prep HPF POC NONE SEEN NONE SEEN   WBC, Wet Prep HPF POC MODERATE (A) NONE SEEN   Sperm NONE SEEN   Urinalysis, Routine w reflex microscopic Urine, Clean Catch     Status: Abnormal   Collection Time: 12/12/20  8:58 AM  Result Value Ref Range   Color, Urine STRAW (A) YELLOW   APPearance HAZY (A) CLEAR   Specific Gravity, Urine 1.002 (L) 1.005 - 1.030   pH 6.0 5.0 - 8.0   Glucose, UA NEGATIVE NEGATIVE mg/dL   Hgb urine dipstick MODERATE (A) NEGATIVE   Bilirubin Urine NEGATIVE NEGATIVE   Ketones, ur NEGATIVE NEGATIVE mg/dL   Protein, ur NEGATIVE NEGATIVE mg/dL   Nitrite NEGATIVE NEGATIVE   Leukocytes,Ua NEGATIVE NEGATIVE   RBC / HPF 6-10 0 - 5 RBC/hpf   WBC, UA 0-5 0 - 5 WBC/hpf   Bacteria, UA RARE (A) NONE SEEN   Squamous Epithelial / LPF 0-5 0 - 5   Mucus PRESENT   CBC     Status: Abnormal   Collection Time: 12/12/20  9:32 AM  Result Value Ref Range   WBC 4.4 4.0 - 10.5 K/uL   RBC 4.42 3.87 - 5.11 MIL/uL   Hemoglobin 11.7 (L) 12.0 - 15.0 g/dL   HCT 12/14/20 87.5 - 64.3 %   MCV 82.8 80.0 - 100.0 fL   MCH 26.5 26.0 - 34.0 pg  MCHC 32.0 30.0 - 36.0 g/dL   RDW 03.4 91.7 - 91.5 %   Platelets 205 150 - 400 K/uL   nRBC  0.0 0.0 - 0.2 %  hCG, quantitative, pregnancy     Status: Abnormal   Collection Time: 12/12/20  9:32 AM  Result Value Ref Range   hCG, Beta Chain, Quant, S 383 (H) <5 mIU/mL  HIV Antibody (routine testing w rflx)     Status: None   Collection Time: 12/12/20  9:32 AM  Result Value Ref Range   HIV Screen 4th Generation wRfx Non Reactive Non Reactive  Comprehensive metabolic panel     Status: Abnormal   Collection Time: 12/12/20 11:03 AM  Result Value Ref Range   Sodium 136 135 - 145 mmol/L   Potassium 4.1 3.5 - 5.1 mmol/L   Chloride 106 98 - 111 mmol/L   CO2 25 22 - 32 mmol/L   Glucose, Bld 99 70 - 99 mg/dL   BUN 7 6 - 20 mg/dL   Creatinine, Ser 0.56 0.44 - 1.00 mg/dL   Calcium 9.0 8.9 - 97.9 mg/dL   Total Protein 6.4 (L) 6.5 - 8.1 g/dL   Albumin 3.5 3.5 - 5.0 g/dL   AST 20 15 - 41 U/L   ALT 23 0 - 44 U/L   Alkaline Phosphatase 61 38 - 126 U/L   Total Bilirubin 0.3 0.3 - 1.2 mg/dL   GFR, Estimated >48 >01 mL/min   Anion gap 5 5 - 15    US OB LESS THAN 14 WEEKS WITH OB TRANSVAGINAL  Result Date: 12/12/2020 CLINICAL DATA:  Vaginal bleeding, pain. EXAM: OBSTETRIC <14 WK [place] AND TRANSVAGINAL OB US TECHNIQUE: Both transabdominal and transvaginal ultrasound examinations were performed for complete evaluation of the gestation as well as the maternal uterus, adnexal regions, and pelvic cul-de-sac. Transvaginal technique was performed to assess early pregnancy. COMPARISON:  None. FINDINGS: Intrauterine gestational sac: None Yolk sac:  Not Visualized. Embryo:  Not Visualized. Cardiac Activity: Not Visualized. Subchorionic hemorrhage:  None visualized. Maternal uterus/adnexae: No free fluid is noted. Right ovary is unremarkable. Left ovary is not visualized. 2.9 x 2.5 x 2.2 cm right adnexal mass is noted which is adjacent to the right ovary. It does not demonstrate increased flow on Doppler and does not have a definitive hypoechoic center. It is complex in appearance and is concerning for possible  ectopic pregnancy. IMPRESSION: No definite evidence of intrauterine pregnancy is noted. 2.9 cm complex mass is noted in the right adnexal region adjacent to the right ovary which could represent ectopic pregnancy, but is not definitive for this diagnosis. Correlation with beta HCG levels as well as with clinical findings is recommended. Critical Value/emergent results were called by telephone at the time of interpretation on 12/12/2020 at 10:05 am to provider Raelyn Mora , who verbally acknowledged these results. Electronically Signed   By: Lupita Raider M.D.  On: 12/12/2020 10:06     *Consult with Dr. Donavan Foil @ 1040 - notified of patient's complaints, assessments, lab & U/S results, tx plan order CMP in case MTX can be used  Updated Dr. Donavan Foil @ 1121. Dr. Donavan Foil on unit, spoke to patient - recommend tx: d/c home with strict ectopic precautions, sx's to return to MAU for and return to MAU for rpt HCG in 48 hrs.  Assessment and Plan  Pregnancy of unknown anatomic location  - Information provided on ectopic pregnancy - Advised to return to MAU for increased pain not relieved with Tylenol -  Return to MAU: If you have heavier bleeding that soaks through more that 2 pads per hour for an hour or more If you bleed so much that you feel like you might pass out or you do pass out If you have significant abdominal pain that is not improved with Tylenol 1000 mg every 6 hours as needed for pain If you develop a fever > 100.5  - Return to MAU Sunday 12/14/20 for repeat HCG    [redacted] weeks gestation of pregnancy   - Discharge patient - Keep f/u MAU on 12/14/20 - Patient verbalized an understanding of the plan of care and agrees.    Raelyn Mora, CNM 12/12/2020, 10:19 AM

## 2020-12-14 ENCOUNTER — Other Ambulatory Visit: Payer: Self-pay

## 2020-12-14 ENCOUNTER — Inpatient Hospital Stay (HOSPITAL_COMMUNITY): Payer: 59

## 2020-12-14 ENCOUNTER — Inpatient Hospital Stay (HOSPITAL_COMMUNITY)
Admission: AD | Admit: 2020-12-14 | Discharge: 2020-12-14 | Disposition: A | Discharge status: Home / Self Care | Payer: 59 | Attending: Obstetrics and Gynecology | Admitting: Obstetrics and Gynecology

## 2020-12-14 DIAGNOSIS — Z3A01 Less than 8 weeks gestation of pregnancy: Secondary | ICD-10-CM | POA: Insufficient documentation

## 2020-12-14 DIAGNOSIS — O26891 Other specified pregnancy related conditions, first trimester: Secondary | ICD-10-CM | POA: Diagnosis not present

## 2020-12-14 DIAGNOSIS — O00101 Right tubal pregnancy without intrauterine pregnancy: Secondary | ICD-10-CM | POA: Diagnosis not present

## 2020-12-14 LAB — COMPREHENSIVE METABOLIC PANEL
ALT: 22 U/L (ref 0–44)
AST: 19 U/L (ref 15–41)
Albumin: 3.6 g/dL (ref 3.5–5.0)
Alkaline Phosphatase: 67 U/L (ref 38–126)
Anion gap: 7 (ref 5–15)
BUN: 8 mg/dL (ref 6–20)
CO2: 24 mmol/L (ref 22–32)
Calcium: 9.2 mg/dL (ref 8.9–10.3)
Chloride: 104 mmol/L (ref 98–111)
Creatinine, Ser: 0.97 mg/dL (ref 0.44–1.00)
GFR, Estimated: >60 mL/min (ref >60)
Glucose, Bld: 119 mg/dL — ABNORMAL HIGH (ref 70–99)
Potassium: 3.9 mmol/L (ref 3.5–5.1)
Sodium: 135 mmol/L (ref 135–145)
Total Bilirubin: 0.5 mg/dL (ref 0.3–1.2)
Total Protein: 6.8 g/dL (ref 6.5–8.1)

## 2020-12-14 LAB — CBC
HCT: 37 % (ref 36.0–46.0)
Hemoglobin: 12.1 g/dL (ref 12.0–15.0)
MCH: 27.1 pg (ref 26.0–34.0)
MCHC: 32.7 g/dL (ref 30.0–36.0)
MCV: 82.8 fL (ref 80.0–100.0)
Platelets: 218 10*3/uL (ref 150–400)
RBC: 4.47 MIL/uL (ref 3.87–5.11)
RDW: 13.6 % (ref 11.5–15.5)
WBC: 4.7 10*3/uL (ref 4.0–10.5)
nRBC: 0 % (ref 0.0–0.2)

## 2020-12-14 LAB — TYPE AND SCREEN
ABO/RH(D): O POS
Antibody Screen: NEGATIVE

## 2020-12-14 LAB — HCG, QUANTITATIVE, PREGNANCY: hCG, Beta Chain, Quant, S: 261 m[IU]/mL — ABNORMAL HIGH (ref <5)

## 2020-12-14 MED ORDER — METHOTREXATE FOR ECTOPIC PREGNANCY
100.0000 mg | Freq: Once | INTRAMUSCULAR | Status: AC
  Administered 2020-12-14: 100 mg via INTRAMUSCULAR
  Filled 2020-12-14: qty 4

## 2020-12-14 NOTE — MAU Note (Signed)
Katelyn [name] is a 35 y.o. at [redacted]w[redacted]d here in MAU reporting: here for follow up hcg. States she is having pain and bleeding still. Pain has decreased but bleeding has increased. Is changing a pad about 1 time per day, states similar to a period for her.  Onset of complaint: ongoing  Pain score: 5/10  Vitals:   12/14/20 1017  BP: 116/80  Pulse: 96  Resp: 16  Temp: 98.4 F (36.9 C)  SpO2: 100%     Lab orders placed from triage: entered by provider

## 2020-12-14 NOTE — MAU Provider Note (Signed)
History   Chief Complaint:  Follow-up   Katelyn [name] is  35 y.o. G2P1001 Patient's last menstrual period was 10/21/2020.Marland Kitchen Patient is here for follow up of quantitative HCG and ongoing surveillance of pregnancy status. She is [redacted]w[redacted]d weeks gestation  by LMP.    Since her last visit, the patient is without new complaint. The patient reports bleeding as  lighter than period.  She reports intermittent lower abdominal cramping.  General ROS:  positive for bleeding and abdominal pain.  Her previous Quantitative HCG values are:  Results for Katelyn, [name] (MRN [id number]) as of 12/14/2020 14:01  Ref. Range 12/12/2020 09:32  HCG, Beta Chain, Quant, S Latest Ref Range: <5 mIU/mL 383 (H)   Physical Exam   Blood pressure 116/80, pulse 96, temperature 98.4 F (36.9 C), temperature source Oral, resp. rate 16, height 5\' 2"  (1.575 m), weight 108 kg, last menstrual period 10/21/2020, SpO2 100 %.  Physical Exam Vitals and nursing note reviewed.  Constitutional:      General: She is not in acute distress.    Appearance: She is well-developed.  HENT:     Head: Normocephalic.  Cardiovascular:     Rate and Rhythm: Normal rate.  Pulmonary:     Effort: Pulmonary effort is normal. No respiratory distress.  Abdominal:     Tenderness: There is no abdominal tenderness.  Musculoskeletal:        General: Normal range of motion.     Cervical back: Normal range of motion.  Skin:    General: Skin is warm and dry.  Neurological:     Mental Status: She is alert and oriented to person, place, and time.  Psychiatric:        Behavior: Behavior normal.        Thought Content: Thought content normal.        Judgment: Judgment normal.   Labs: Results for orders placed or performed during the hospital encounter of 12/14/20 (from the past 24 hour(s))  hCG, quantitative, pregnancy   Collection Time: 12/14/20 10:37 AM  Result Value Ref Range   hCG, Beta Chain, Quant, S 261 (H) <5 mIU/mL  CBC    Collection Time: 12/14/20 10:37 AM  Result Value Ref Range   WBC 4.7 4.0 - 10.5 K/uL   RBC 4.47 3.87 - 5.11 MIL/uL   Hemoglobin 12.1 12.0 - 15.0 g/dL   HCT 12/16/20 93.9 - 03.0 %   MCV 82.8 80.0 - 100.0 fL   MCH 27.1 26.0 - 34.0 pg   MCHC 32.7 30.0 - 36.0 g/dL   RDW 09.2 33.0 - 07.6 %   Platelets 218 150 - 400 K/uL   nRBC 0.0 0.0 - 0.2 %  Comprehensive metabolic panel   Collection Time: 12/14/20 10:37 AM  Result Value Ref Range   Sodium 135 135 - 145 mmol/L   Potassium 3.9 3.5 - 5.1 mmol/L   Chloride 104 98 - 111 mmol/L   CO2 24 22 - 32 mmol/L   Glucose, Bld 119 (H) 70 - 99 mg/dL   BUN 8 6 - 20 mg/dL   Creatinine, Ser 12/16/20 0.44 - 1.00 mg/dL   Calcium 9.2 8.9 - 3.33 mg/dL   Total Protein 6.8 6.5 - 8.1 g/dL   Albumin 3.6 3.5 - 5.0 g/dL   AST 19 15 - 41 U/L   ALT 22 0 - 44 U/L   Alkaline Phosphatase 67 38 - 126 U/L   Total Bilirubin 0.5 0.3 - 1.2 mg/dL   GFR, Estimated >  60 >60 mL/min   Anion gap 7 5 - 15  Type and screen [facility]   Collection Time: 12/14/20 10:40 AM  Result Value Ref Range   ABO/RH(D) O POS    Antibody Screen NEG    Sample Expiration      12/17/2020,2359 Performed at [facility], [address] [place], [place] [address]     Ultrasound Studies:   US OB Transvaginal  Result Date: 12/14/2020 CLINICAL DATA:  Vaginal bleeding with progressive pain. EXAM: TRANSVAGINAL OB ULTRASOUND TECHNIQUE: Transvaginal ultrasound was performed for complete evaluation of the gestation as well as the maternal uterus, adnexal regions, and pelvic cul-de-sac. COMPARISON:  None. FINDINGS: Intrauterine gestational sac: None Yolk sac:  Not Visualized. Embryo:  Not Visualized. Subchorionic hemorrhage:  None visualized. Maternal uterus/adnexae: Subchorionic hemorrhage: Not Right ovary: 3.6 x 2.8 x 2.8 cm Left ovary: 3 x 2.5 x 2.0 cm Other :Adjacent to the right ovary is a predominantly solid appearing mass measuring 3 x 3 x 2.6 cm. This contains a peripheral thin  walled cystic structure measuring approximately 0.8 cm. No definite yolk sac or embryo identified within this structure. Free fluid:  No free fluid. IMPRESSION: 1. No intrauterine gestational sac, yolk sac, or fetal pole identified. Differential considerations include intrauterine pregnancy too early to be sonographically visualized, missed abortion, or ectopic pregnancy. Followup ultrasound is recommended in 10-14 days for further evaluation. 2. Persistent mass within the right adnexa adjacent to the right ovary for which ectopic pregnancy cannot be excluded. 3. Critical Value/emergent results were called by telephone at the time of interpretation on 12/14/2020 at 12:12 pm to provider Tristin Gladman , who verbally acknowledged these results. Electronically Signed   By: Signa Kell M.D.   On: 12/14/2020 12:12   US OB LESS THAN 14 WEEKS WITH OB TRANSVAGINAL  Result Date: 12/12/2020 CLINICAL DATA:  Vaginal bleeding, pain. EXAM: OBSTETRIC <14 WK [place] AND TRANSVAGINAL OB US TECHNIQUE: Both transabdominal and transvaginal ultrasound examinations were performed for complete evaluation of the gestation as well as the maternal uterus, adnexal regions, and pelvic cul-de-sac. Transvaginal technique was performed to assess early pregnancy. COMPARISON:  None. FINDINGS: Intrauterine gestational sac: None Yolk sac:  Not Visualized. Embryo:  Not Visualized. Cardiac Activity: Not Visualized. Subchorionic hemorrhage:  None visualized. Maternal uterus/adnexae: No free fluid is noted. Right ovary is unremarkable. Left ovary is not visualized. 2.9 x 2.5 x 2.2 cm right adnexal mass is noted which is adjacent to the right ovary. It does not demonstrate increased flow on Doppler and does not have a definitive hypoechoic center. It is complex in appearance and is concerning for possible ectopic pregnancy. IMPRESSION: No definite evidence of intrauterine pregnancy is noted. 2.9 cm complex mass is noted in the right adnexal region  adjacent to the right ovary which could represent ectopic pregnancy, but is not definitive for this diagnosis. Correlation with beta HCG levels as well as with clinical findings is recommended. Critical Value/emergent results were called by telephone at the time of interpretation on 12/12/2020 at 10:05 am to provider Raelyn Mora , who verbally acknowledged these results. Electronically Signed   By: Lupita Raider M.D.   On: 12/12/2020 10:06    Assessment:   1. 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 [redacted] weeks gestation of pregnancy     Consulted with Dr. Donavan Foil regarding presentation and results- recommends methotrexate treatment for suspected ectopic pregnancy.   Reviewed labs and images with patient and partner  at length. Discussed risks and benefits of treatment vs continued surveillance and reasons for recommending MTX. All questions answered and patient verbalized plan of care. Will proceed with MTX.  Warning signs of when to return to MAU reviewed at length. Appointment made for Day 4 labs and will return to MAU for day 7 labs.  Plan: -Discharge home in stable condition -Strict ectopic precautions discussed -Patient advised to follow-up with [facility] as discussed for repeat HCG -Patient may return to MAU as needed or if her condition were to change or worsen  Rolm Bookbinder, CNM 12/14/2020, 2:01 PM

## 2020-12-15 LAB — GC/CHLAMYDIA PROBE AMP (~~LOC~~) NOT AT ARMC
Chlamydia: NEGATIVE
Comment: NEGATIVE
Comment: NORMAL
Neisseria Gonorrhea: NEGATIVE

## 2020-12-17 ENCOUNTER — Telehealth: Payer: Self-pay

## 2020-12-17 ENCOUNTER — Ambulatory Visit: Payer: 59

## 2020-12-17 NOTE — Telephone Encounter (Signed)
Called pt to follow up missed lab appt today. VM left. Called second number on chart, call cannot be completed. MyChart message sent.

## 2020-12-20 ENCOUNTER — Telehealth: Payer: Self-pay

## 2020-12-20 NOTE — Telephone Encounter (Signed)
Attempted to call patient regarding follow up post MTX. No answer and message left. Will send MyChart and instructed patient to come to MAU for repeat blood work.   Rolm Bookbinder, CNM 12/20/20 3:56 PM

## 2021-05-07 ENCOUNTER — Encounter (HOSPITAL_COMMUNITY): Payer: Self-pay | Admitting: Obstetrics and Gynecology

## 2021-05-07 ENCOUNTER — Other Ambulatory Visit: Payer: Self-pay

## 2021-05-07 ENCOUNTER — Inpatient Hospital Stay (HOSPITAL_COMMUNITY)
Admission: AD | Admit: 2021-05-07 | Discharge: 2021-05-07 | Disposition: A | Discharge status: Home / Self Care | Payer: 59 | Attending: Obstetrics and Gynecology | Admitting: Obstetrics and Gynecology

## 2021-05-07 DIAGNOSIS — O26891 Other specified pregnancy related conditions, first trimester: Secondary | ICD-10-CM | POA: Diagnosis present

## 2021-05-07 DIAGNOSIS — Z3A12 12 weeks gestation of pregnancy: Secondary | ICD-10-CM | POA: Insufficient documentation

## 2021-05-07 DIAGNOSIS — O09291 Supervision of pregnancy with other poor reproductive or obstetric history, first trimester: Secondary | ICD-10-CM | POA: Diagnosis not present

## 2021-05-07 DIAGNOSIS — O209 Hemorrhage in early pregnancy, unspecified: Secondary | ICD-10-CM | POA: Diagnosis not present

## 2021-05-07 DIAGNOSIS — Z87891 Personal history of nicotine dependence: Secondary | ICD-10-CM | POA: Diagnosis not present

## 2021-05-07 DIAGNOSIS — R109 Unspecified abdominal pain: Secondary | ICD-10-CM | POA: Insufficient documentation

## 2021-05-07 LAB — URINALYSIS, ROUTINE W REFLEX MICROSCOPIC
Bilirubin Urine: NEGATIVE
Glucose, UA: NEGATIVE mg/dL
Ketones, ur: NEGATIVE mg/dL
Leukocytes,Ua: NEGATIVE
Nitrite: NEGATIVE
Protein, ur: NEGATIVE mg/dL
Specific Gravity, Urine: 1.004 — ABNORMAL LOW (ref 1.005–1.030)
pH: 9 — ABNORMAL HIGH (ref 5.0–8.0)

## 2021-05-07 LAB — POCT PREGNANCY, URINE: Preg Test, Ur: POSITIVE — AB

## 2021-05-07 NOTE — MAU Note (Signed)
Unable to doppler FHT 

## 2021-05-07 NOTE — MAU Note (Signed)
Katelyn [name] is a 35 y.o. at [redacted]w[redacted]d here in MAU reporting: states she goes to [place] in [place]. Today when she used the bathroom she saw some bleeding, states when using the bathroom here she saw some more bleeding but it was lighter. Now is having some cramping.  LMP: 02/10/2021  Onset of complaint: today  Pain score: 5/10  Vitals:   05/07/21 1232  BP: 120/68  Pulse: 100  Resp: 18  Temp: 98.3 F (36.8 C)  SpO2: 100%     Lab orders placed from triage: ua, upt

## 2021-05-07 NOTE — MAU Provider Note (Signed)
History     [id number]  Arrival date and time: 05/07/21 1208    Chief Complaint  Patient presents with   Abdominal Pain   Vaginal Bleeding     HPI Katelyn [name] is a 35 y.o. at [redacted]w[redacted]d with PMHx notable for one prior cesarean, who presents for vaginal bleeding and abdominal cramping.   Patient reports she had bleeding slightly less than a period that started this morning when she went to the bathroom No gush of fluid No other vaginal discharge Last intercourse about a week ago Some mild abdominal cramping as well in pelvis Has had [place] at her OB in [place], told everything was normal about a month ago Has hx of of one ectopic pregnancy earlier this year  --/--/O POS (07/31 1040)  OB History     Gravida  3   Para  1   Term  1   Preterm      AB  1   Living  1      SAB      IAB      Ectopic  1   Multiple  0   Live Births  1           Past Medical History:  Diagnosis Date   Anemia    prior to pregnancy    Anxiety    Back abrasion    Back pain    GERD (gastroesophageal reflux disease)    Migraines     Past Surgical History:  Procedure Laterality Date   CESAREAN SECTION N/A 05/30/2015   Procedure: CESAREAN SECTION;  Surgeon: Candice Camp, MD;  Location: [facility];  Service: Obstetrics;  Laterality: N/A;    Family History  Problem Relation Age of Onset   Heart failure Father 67   Heart disease Father 105   Breast cancer Paternal Grandmother    Kidney disease Paternal Grandmother    Diabetes type I Paternal Grandmother    Breast cancer Maternal Grandmother    Kidney disease Maternal Grandmother    Diabetes type I Maternal Grandmother    Rheum arthritis Maternal Grandmother    Kidney disease Maternal Uncle    Diabetes type I Maternal Aunt    Diabetes Maternal Aunt    Diabetes Maternal Uncle    Asthma Sister    Hypertension Mother    Thyroid disease Paternal Aunt    Rheum arthritis Maternal Aunt    Hemophilia Cousin        2 cousins     Social History   Socioeconomic History   Marital status: Married    Spouse name: Not on file   Number of children: Not on file   Years of education: Not on file   Highest education level: Not on file  Occupational History   Not on file  Tobacco Use   Smoking status: Never   Smokeless tobacco: Never   Tobacco comments:    casual smoker in college  Vaping Use   Vaping Use: Never used  Substance and Sexual Activity   Alcohol use: Not Currently    Comment: occasionally   Drug use: No   Sexual activity: Yes  Other Topics Concern   Not on file  Social History Narrative   Not on file   Social Determinants of Health   Financial Resource Strain: Not on file  Food Insecurity: Not on file  Transportation Needs: Not on file  Physical Activity: Not on file  Stress: Not on file  Social Connections: Not on  file  Intimate Partner Violence: Not on file    No Known Allergies  No current facility-administered medications on file prior to encounter.   Current Outpatient Medications on File Prior to Encounter  Medication Sig Dispense Refill   Prenatal Vit-Fe Fumarate-FA (PRENATAL MULTIVITAMIN) TABS tablet Take 1 tablet by mouth daily at 12 noon.       ROS Pertinent positives and negative per HPI, all others reviewed and negative  Physical Exam   BP 119/67    Pulse (!) 105    Temp 98.3 F (36.8 C) (Oral)    Resp 18    Ht 5\' 4"  (1.626 m)    Wt 108 kg    LMP 02/10/2021    SpO2 99%    Breastfeeding Unknown    BMI 40.85 kg/m   Patient Vitals for the past 24 hrs:  BP Temp Temp src Pulse Resp SpO2 Height Weight  05/07/21 1244 119/67 -- -- (!) 105 -- 99 % -- --  05/07/21 1232 120/68 98.3 F (36.8 C) Oral 100 18 100 % -- --  05/07/21 1229 -- -- -- -- -- -- 5\' 4"  (1.626 m) 108 kg    Physical Exam Vitals reviewed.  Constitutional:      General: She is not in acute distress.    Appearance: She is well-developed. She is not diaphoretic.  Eyes:     General: No scleral  icterus. Pulmonary:     Effort: Pulmonary effort is normal. No respiratory distress.  Abdominal:     General: There is no distension.     Palpations: Abdomen is soft.     Tenderness: There is no abdominal tenderness. There is no guarding or rebound.  Skin:    General: Skin is warm and dry.  Neurological:     Mental Status: She is alert.     Coordination: Coordination normal.     Cervical Exam    Bedside Ultrasound Pt informed that the ultrasound is considered a limited OB ultrasound and is not intended to be a complete ultrasound exam.  Patient also informed that the ultrasound is not being completed with the intent of assessing for fetal or placental anomalies or any pelvic abnormalities.  Explained that the purpose of todays ultrasound is to assess for  viability.  Patient acknowledges the purpose of the exam and the limitations of the study.    My interpretation: viable IUP, subjectively normal fluid, FHR 162 bpm by m-mode, normal somatic movement   Labs Results for orders placed or performed during the hospital encounter of 05/07/21 (from the past 24 hour(s))  Pregnancy, urine POC     Status: Abnormal   Collection Time: 05/07/21 12:21 PM  Result Value Ref Range   Preg Test, Ur POSITIVE (A) NEGATIVE    Imaging No results found.  MAU Course  Procedures Lab Orders         Urinalysis, Routine w reflex microscopic Urine, Clean Catch         Pregnancy, urine POC    No orders of the defined types were placed in this encounter.  Imaging Orders  No imaging studies ordered today    MDM moderate  Assessment and Plan  #Vaginal bleeding in pregnancy, first trimester #[redacted] weeks gestation of pregnancy Viable IUP with subjectively normal fluid and normal FHR/movement on bedside 05/09/21. Reassured patient that this is common and 80-90% of pregnancies with first trimester bleeding go on to have normal pregnancies, though there is a 10-20% of miscarriage but there are no  effective  interventions. No SCH or other etiology seen. Offered swabs for infection, patient declined. Reviewed warning signs/return precautions.    Dispo: discharged to home in stable condition with return precautions    Venora Maples, MD/MPH 05/07/21 1:30 PM  Allergies as of 05/07/2021   No Known Allergies      Medication List     TAKE these medications    prenatal multivitamin Tabs tablet Take 1 tablet by mouth daily at 12 noon.

## 2022-02-26 ENCOUNTER — Telehealth: Payer: 59 | Admitting: Physician Assistant

## 2022-02-26 DIAGNOSIS — J019 Acute sinusitis, unspecified: Secondary | ICD-10-CM | POA: Diagnosis not present

## 2022-02-26 DIAGNOSIS — B9689 Other specified bacterial agents as the cause of diseases classified elsewhere: Secondary | ICD-10-CM

## 2022-02-26 MED ORDER — AMOXICILLIN-POT CLAVULANATE 875-125 MG PO TABS: 1.0000 | ORAL_TABLET | Freq: Two times a day (BID) | ORAL | 0 refills | Status: DC

## 2022-02-26 NOTE — Progress Notes (Signed)

## 2022-03-22 ENCOUNTER — Other Ambulatory Visit: Payer: Self-pay | Admitting: Obstetrics and Gynecology

## 2022-03-22 DIAGNOSIS — R9389 Abnormal findings on diagnostic imaging of other specified body structures: Secondary | ICD-10-CM

## 2022-03-25 ENCOUNTER — Ambulatory Visit
Admission: RE | Admit: 2022-03-25 | Discharge: 2022-03-25 | Disposition: A | Discharge status: Home / Self Care | Payer: 59 | Source: Ambulatory Visit | Attending: Obstetrics and Gynecology | Admitting: Obstetrics and Gynecology

## 2022-03-25 DIAGNOSIS — R9389 Abnormal findings on diagnostic imaging of other specified body structures: Secondary | ICD-10-CM

## 2022-03-25 MED ORDER — GADOBENATE DIMEGLUMINE 529 MG/ML IV SOLN
20.0000 mL | Freq: Once | INTRAVENOUS | Status: AC | PRN
  Administered 2022-03-25: 20 mL via INTRAVENOUS

## 2022-04-03 ENCOUNTER — Other Ambulatory Visit: Payer: 59

## 2022-05-05 ENCOUNTER — Other Ambulatory Visit: Payer: Self-pay | Admitting: Obstetrics and Gynecology

## 2022-06-17 ENCOUNTER — Encounter (HOSPITAL_BASED_OUTPATIENT_CLINIC_OR_DEPARTMENT_OTHER): Payer: Self-pay | Admitting: Obstetrics and Gynecology

## 2022-06-17 NOTE — Progress Notes (Signed)
Called pt via phone for pre-op interview for surgery 06-21-2022 by Dr Delsa Sale @ [facility].  Pt stated she has already spoken to office and procedure is to be cancelled due to issue with new insurance.  Called and left message w/ OR scheduler, Del, let her know pt still on the schedule.

## 2022-06-21 ENCOUNTER — Ambulatory Visit (HOSPITAL_BASED_OUTPATIENT_CLINIC_OR_DEPARTMENT_OTHER): Admission: RE | Admit: 2022-06-21 | Payer: 59 | Source: Home / Self Care | Admitting: Obstetrics and Gynecology

## 2022-06-21 HISTORY — DX: Other intra-abdominal and pelvic swelling, mass and lump: R19.09

## 2022-06-21 HISTORY — DX: Abnormal uterine and vaginal bleeding, unspecified: N93.9

## 2022-06-21 SURGERY — DILATATION AND CURETTAGE /HYSTEROSCOPY
Anesthesia: Choice

## 2022-10-22 IMAGING — US US OB < 14 WEEKS - US OB TV
1 series · 15 of 28 positions shown · non-contrast
Comparison: None.

CLINICAL DATA: Vaginal bleeding, pain.

EXAM:
OBSTETRIC <14 WK US AND TRANSVAGINAL OB US
TECHNIQUE: Both transabdominal and transvaginal ultrasound examinations were
performed for complete evaluation of the gestation as well as the
maternal uterus, adnexal regions, and pelvic cul-de-sac.
Transvaginal technique was performed to assess early pregnancy.

[Series 1: us ob < 14 weeks - us ob tv · 15 of 49 slices shown]
[im 1/49]
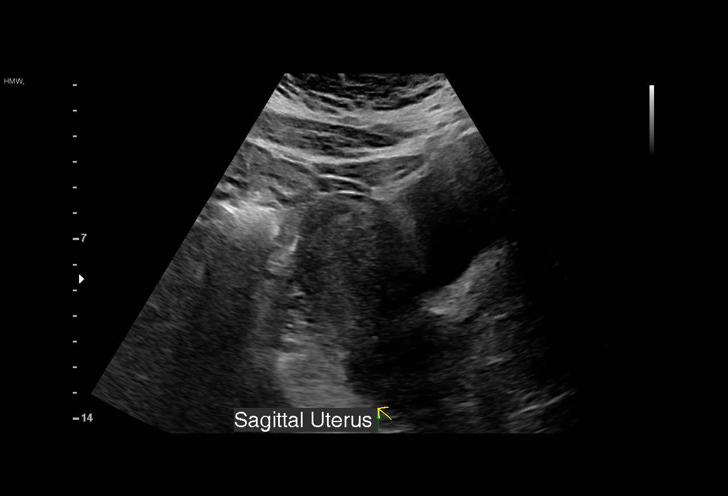
[im 4/49]
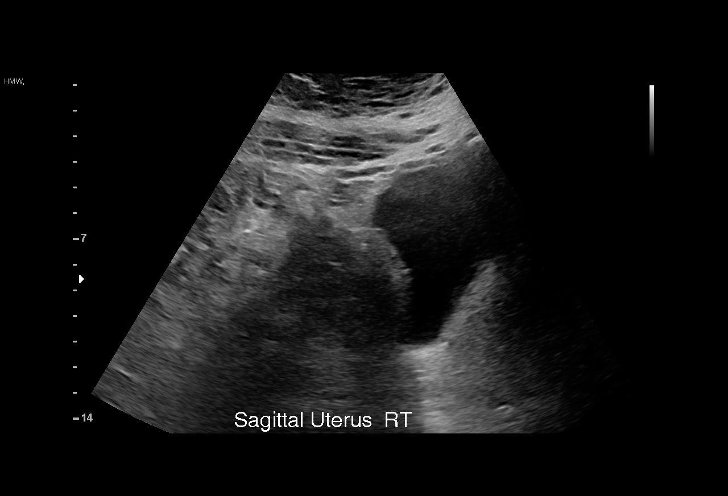
[im 8/49]
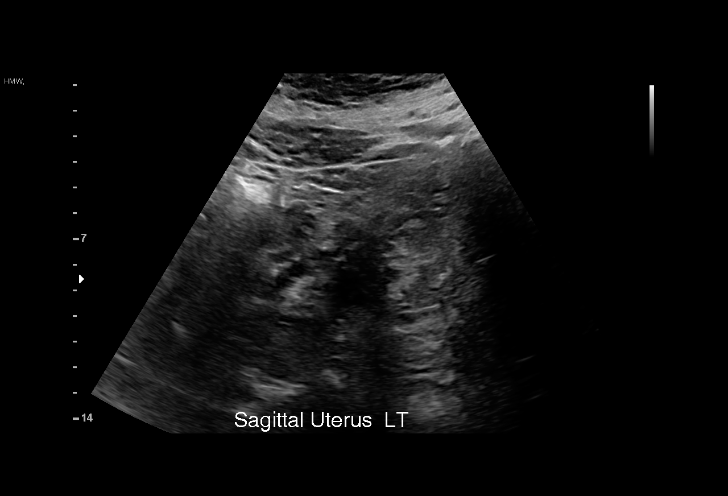
[im 11/49]
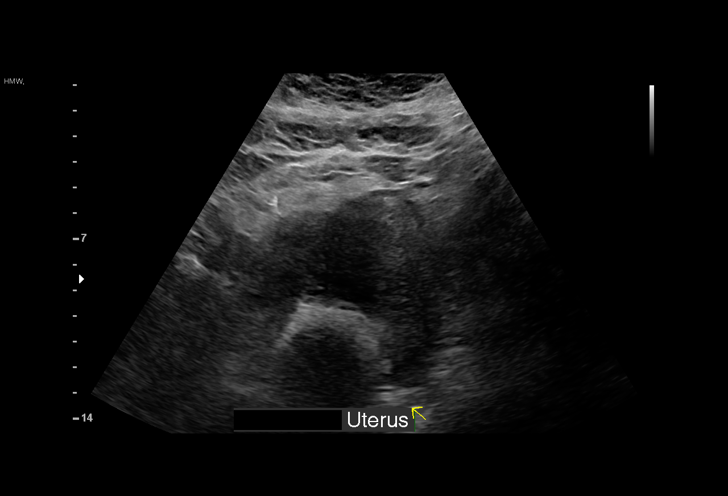
[im 15/49]
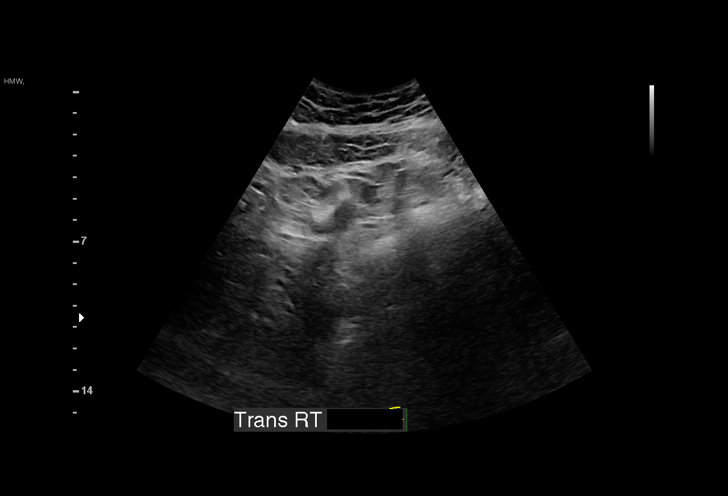
[im 18/49]
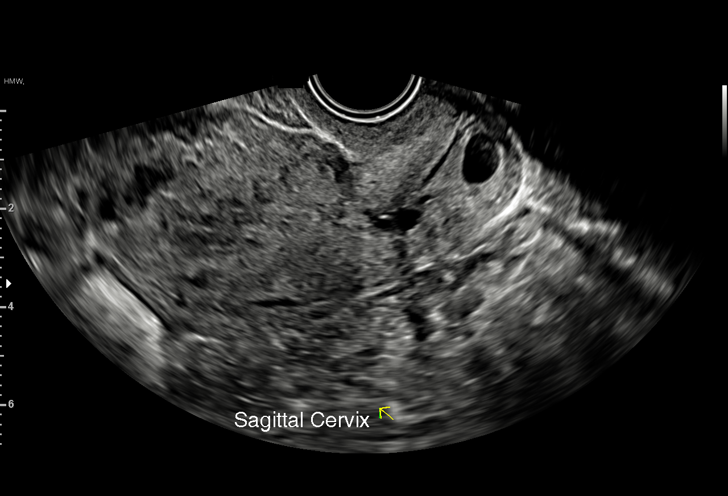
[im 22/49]
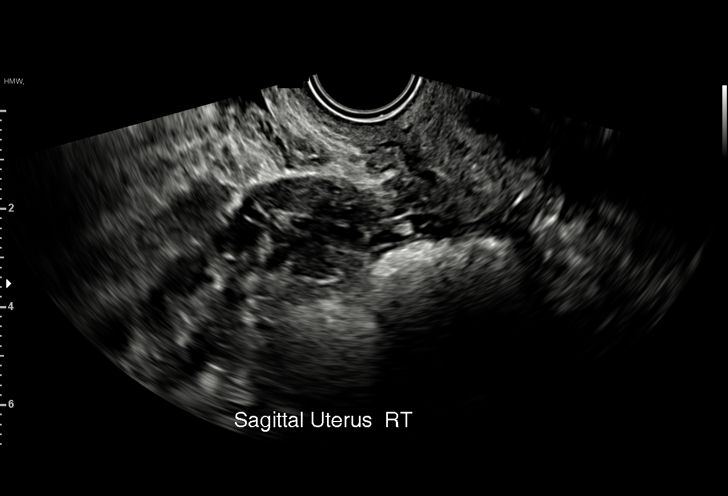
[im 25/49]
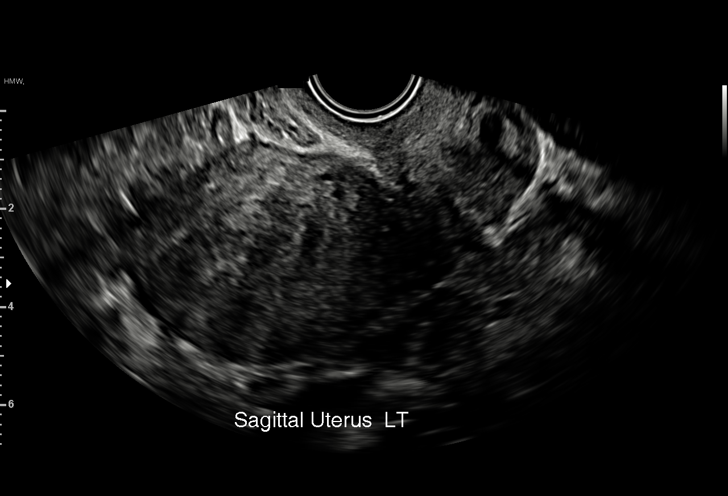
[im 27/49]
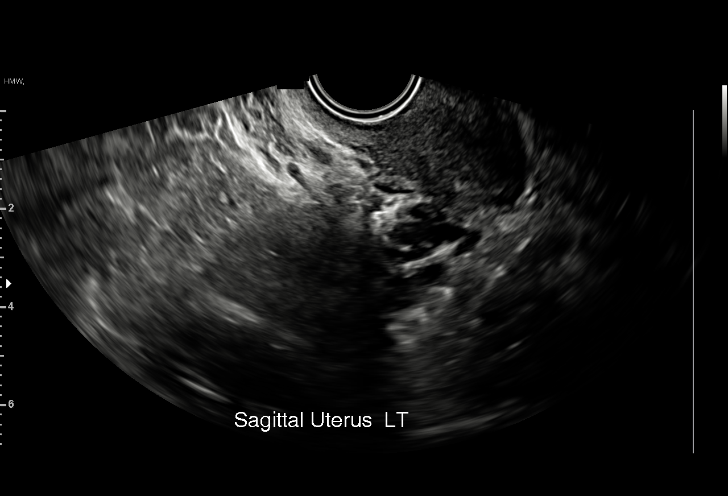
[im 31/49]
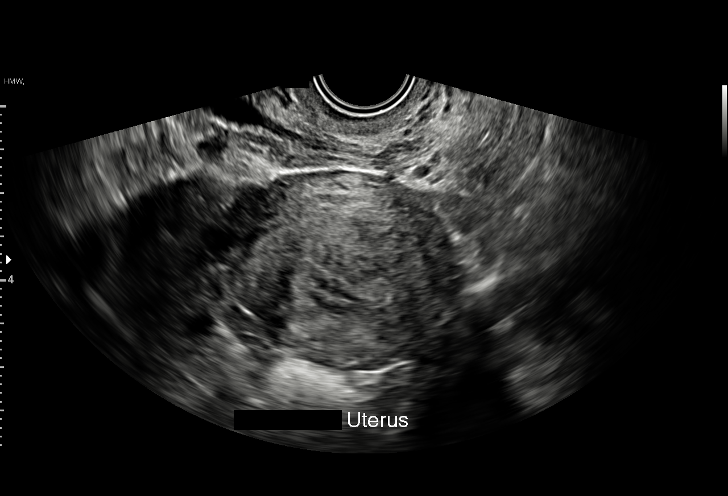
[im 34/49]
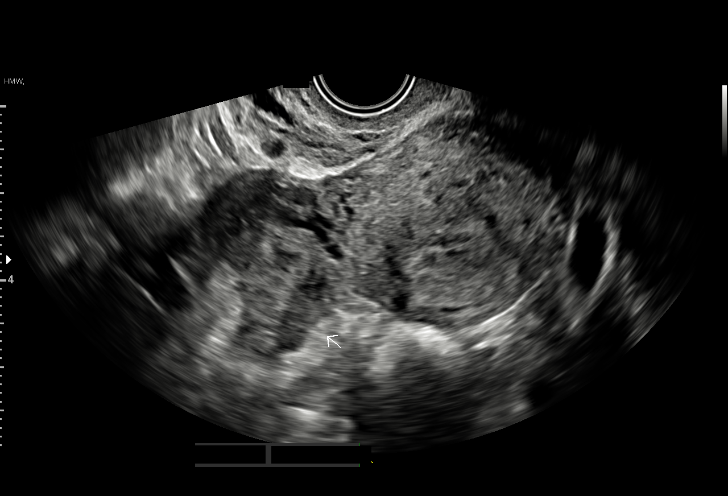
[im 38/49]
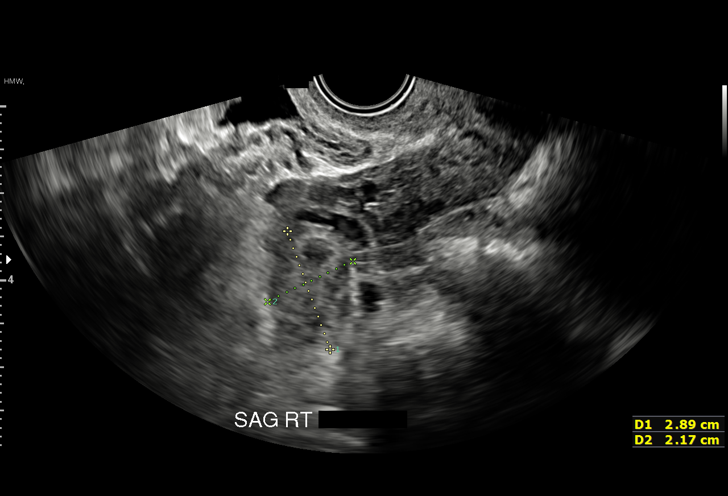
[im 41/49]
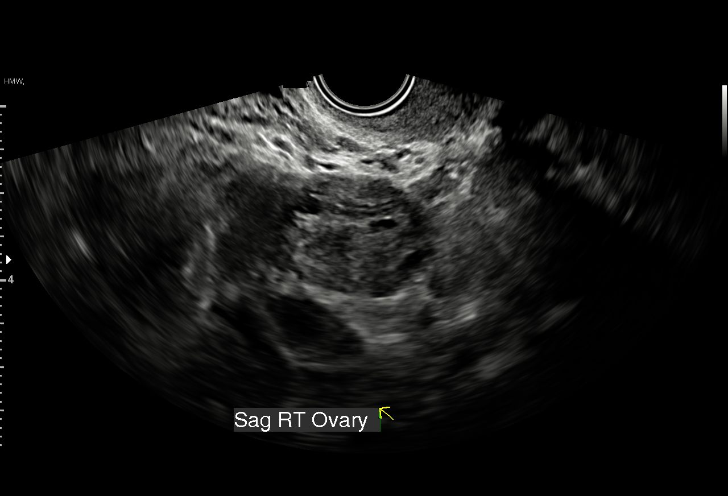
[im 45/49]
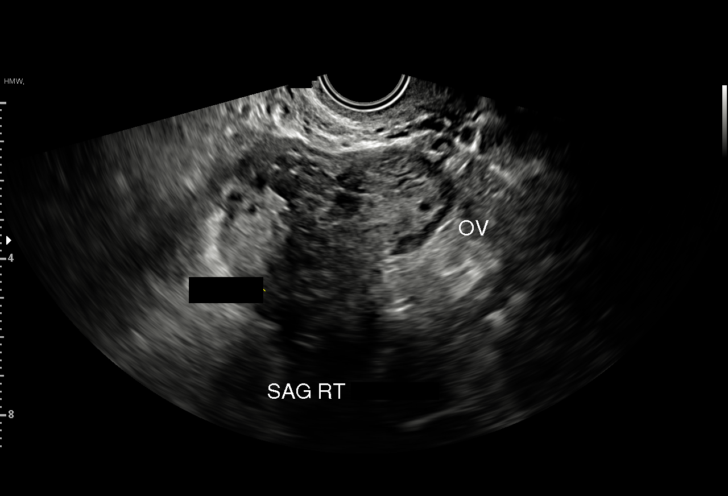
[im 49/49]
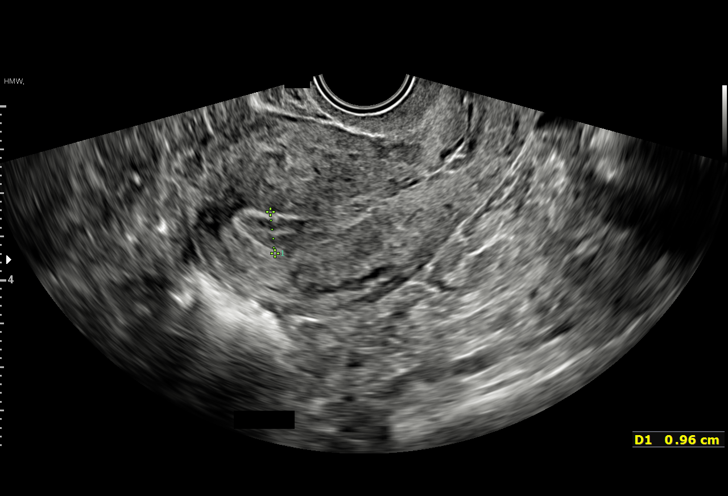

[15 of 28 positions shown; findings below may reference images not displayed]

FINDINGS: Intrauterine gestational sac: None

Yolk sac:  Not Visualized.

Embryo:  Not Visualized.

Cardiac Activity: Not Visualized.

Subchorionic hemorrhage:  None visualized.

Maternal uterus/adnexae: No free fluid is noted. Right ovary is
unremarkable. Left ovary is not visualized. 2.9 x 2.5 x 2.2 cm right
adnexal mass is noted which is adjacent to the right ovary. It does
not demonstrate increased flow on Doppler and does not have a
definitive hypoechoic center. It is complex in appearance and is
concerning for possible ectopic pregnancy.
IMPRESSION: No definite evidence of intrauterine pregnancy is noted. 2.9 cm
complex mass is noted in the right adnexal region adjacent to the
right ovary which could represent ectopic pregnancy, but is not
definitive for this diagnosis. Correlation with beta HCG levels as
well as with clinical findings is recommended. Critical
Value/emergent results were called by telephone at the time of
interpretation on 12/12/2020 at [DATE] to provider KEKAGO FAKUNDO ,
who verbally acknowledged these results.

## 2022-10-24 IMAGING — US US OB TRANSVAGINAL
2 series · 15 of 28 positions shown · non-contrast
Comparison: None.

CLINICAL DATA: Vaginal bleeding with progressive pain.

EXAM:
TRANSVAGINAL OB ULTRASOUND
TECHNIQUE: Transvaginal ultrasound was performed for complete evaluation of the
gestation as well as the maternal uterus, adnexal regions, and
pelvic cul-de-sac.

[Series 1: us ob transvaginal · 14 of 58 slices shown (1 of 2)]
[im 1/58]
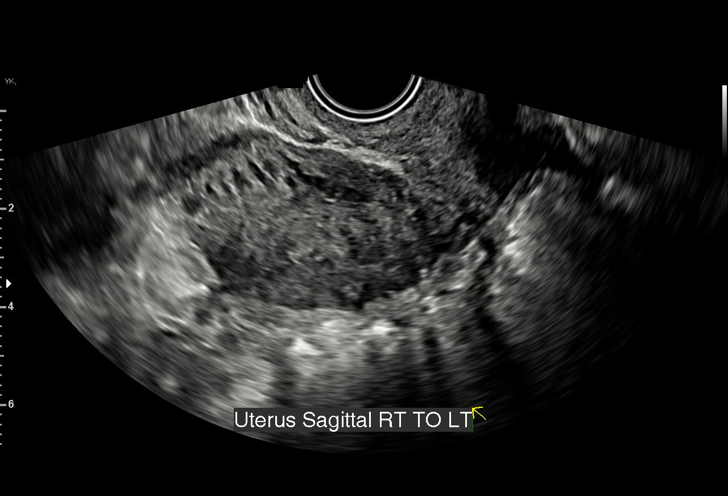
[im 5/58]
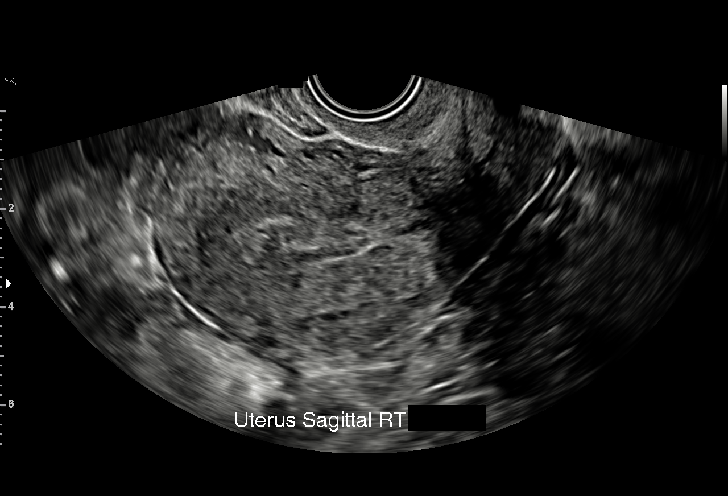
[im 10/58]
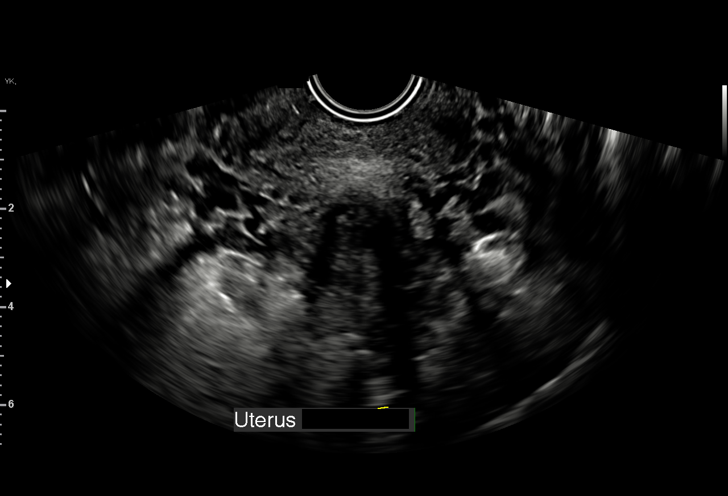
[im 14/58]
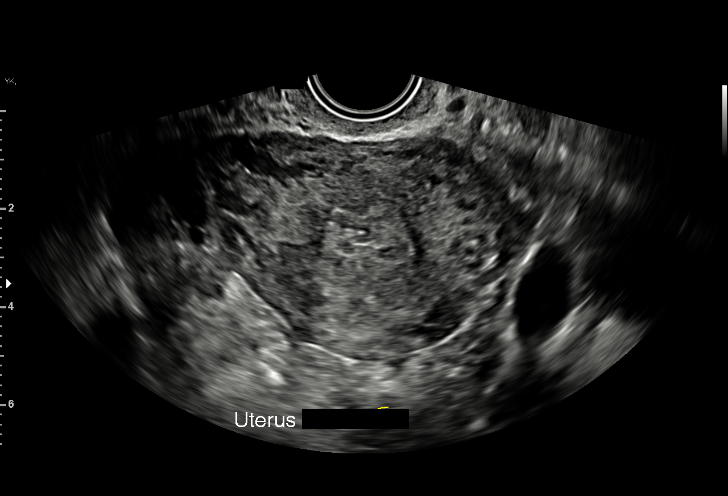
[im 19/58]
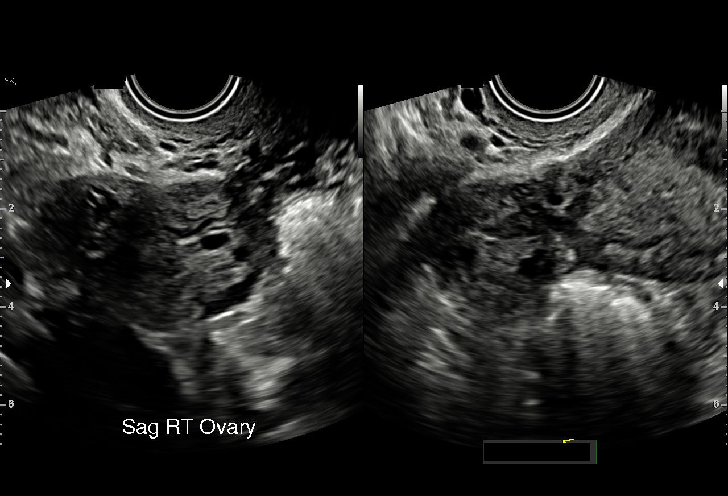
[im 23/58]
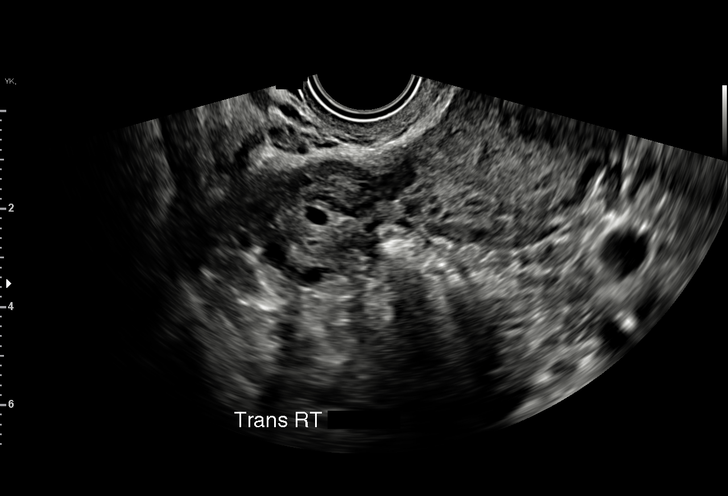
[im 28/58]
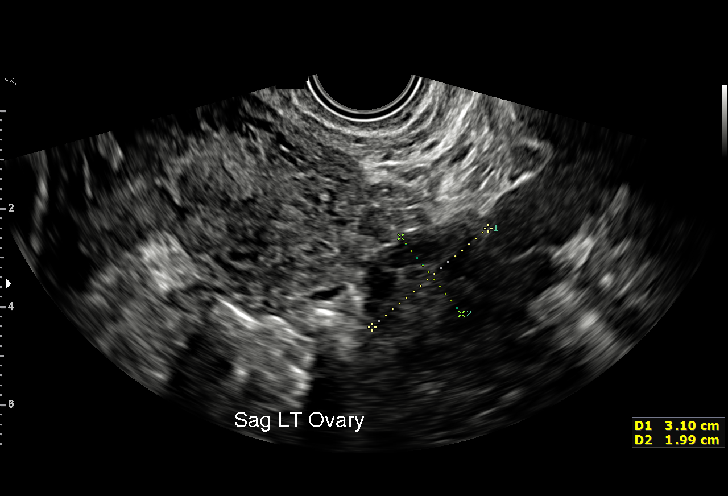
[im 32/58]
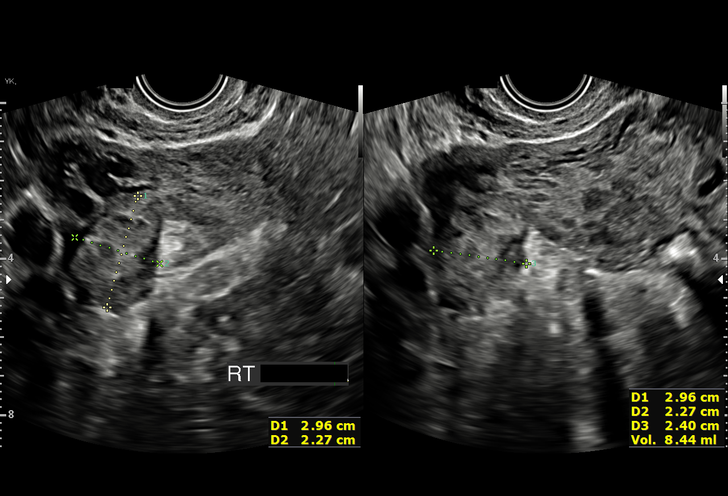
[im 35/58]
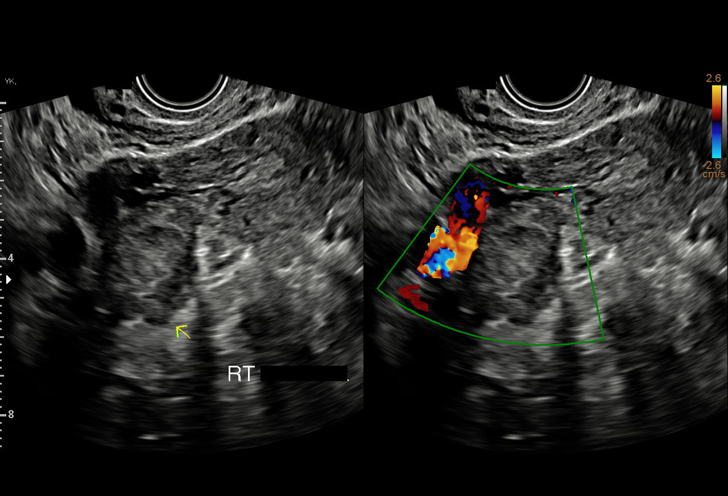
[im 39/58]
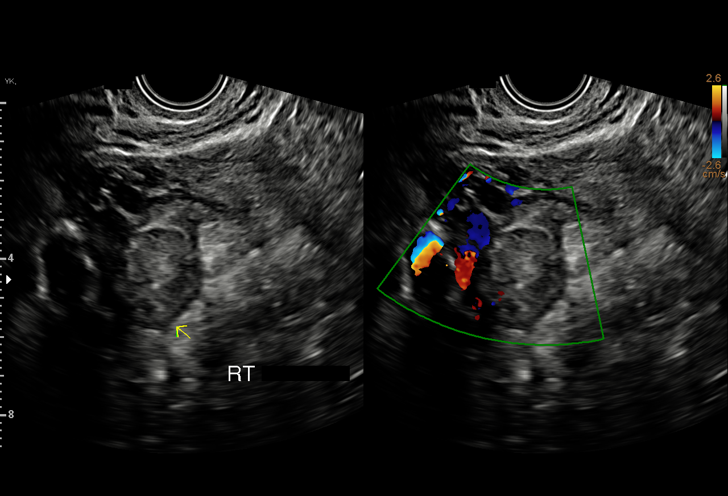
[im 44/58]
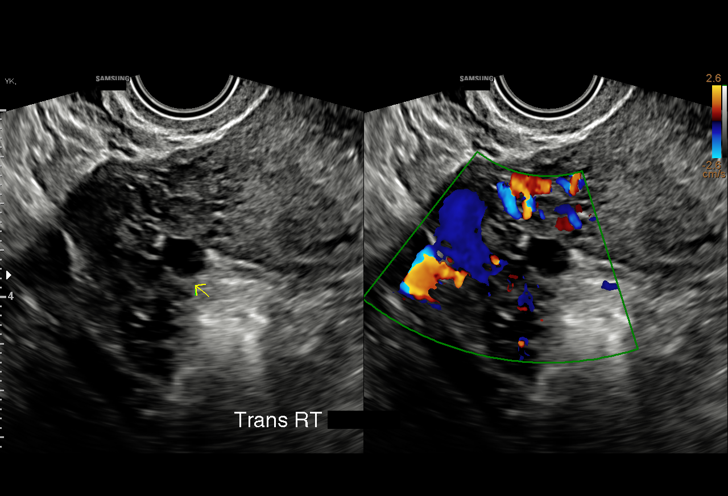
[im 48/58]
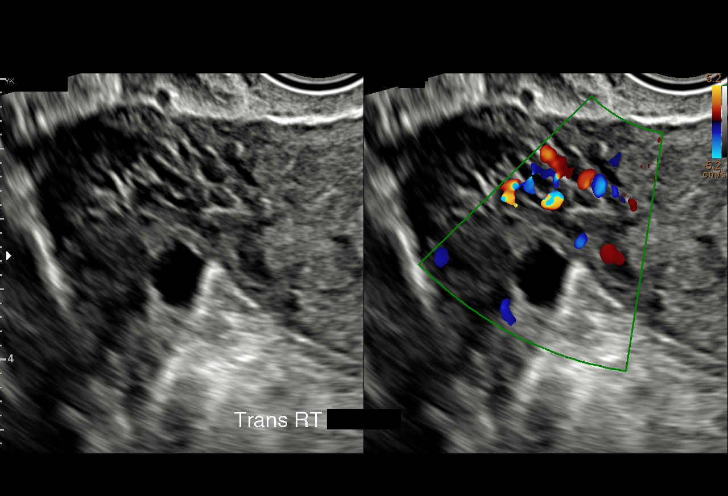
[im 53/58]
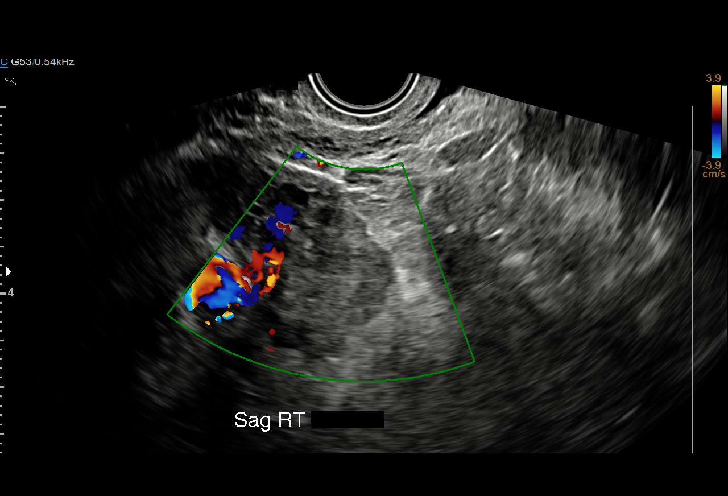
[im 58/58]
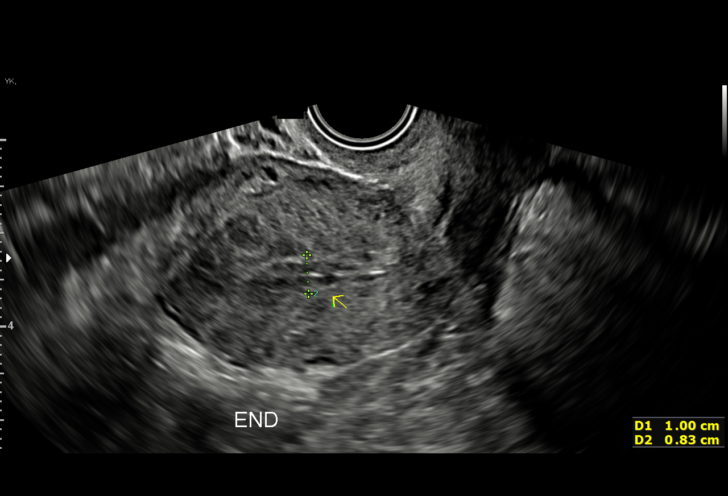

[Series 2: us ob transvaginal · 1 of 5 slices shown (2 of 2)]
[im 5/5]
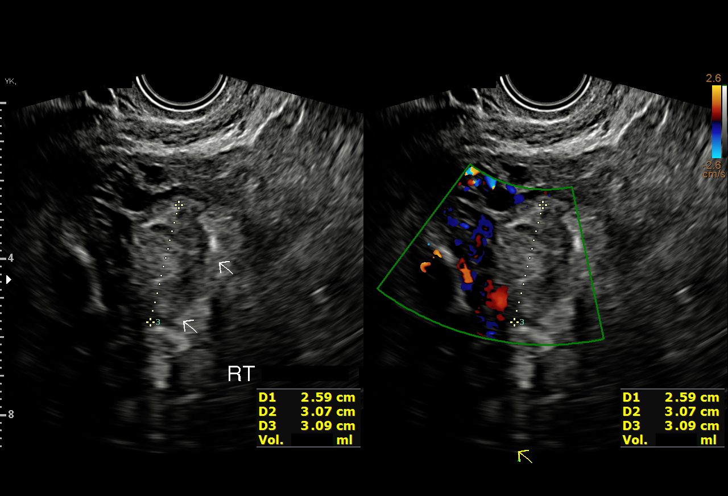

[15 of 28 positions shown; findings below may reference images not displayed]

FINDINGS: Intrauterine gestational sac: None

Yolk sac:  Not Visualized.

Embryo:  Not Visualized.

Subchorionic hemorrhage:  None visualized.

Maternal uterus/adnexae:

Subchorionic hemorrhage: Not

Right ovary: 3.6 x 2.8 x 2.8 cm

Left ovary: 3 x 2.5 x 2.0 cm

Other :Adjacent to the right ovary is a predominantly solid
appearing mass measuring 3 x 3 x 2.6 cm. This contains a peripheral
thin walled cystic structure measuring approximately 0.8 cm. No
definite yolk sac or embryo identified within this structure.

Free fluid:  No free fluid.
IMPRESSION: 1. No intrauterine gestational sac, yolk sac, or fetal pole
identified. Differential considerations include intrauterine
pregnancy too early to be sonographically visualized, missed
abortion, or ectopic pregnancy. Followup ultrasound is recommended
in 10-14 days for further evaluation.
2. Persistent mass within the right adnexa adjacent to the right
ovary for which ectopic pregnancy cannot be excluded.
3. Critical Value/emergent results were called by telephone at the
time of interpretation on 12/14/2020 at [DATE] to provider YUGOV
BEIHAN , who verbally acknowledged these results.
